# Patient Record
Sex: Female | Born: 1965 | Race: White | Hispanic: No | Marital: Married | Smoking: Never smoker
Health system: Southern US, Community
[De-identification: ages and names within clinical notes are randomized; demographics above are authoritative.]

## PROBLEM LIST (undated history)

## (undated) DIAGNOSIS — J189 Pneumonia, unspecified organism: Secondary | ICD-10-CM

## (undated) DIAGNOSIS — F419 Anxiety disorder, unspecified: Secondary | ICD-10-CM

## (undated) DIAGNOSIS — K5790 Diverticulosis of intestine, part unspecified, without perforation or abscess without bleeding: Secondary | ICD-10-CM

## (undated) DIAGNOSIS — G43909 Migraine, unspecified, not intractable, without status migrainosus: Secondary | ICD-10-CM

## (undated) DIAGNOSIS — K648 Other hemorrhoids: Secondary | ICD-10-CM

## (undated) DIAGNOSIS — E0590 Thyrotoxicosis, unspecified without thyrotoxic crisis or storm: Secondary | ICD-10-CM

## (undated) DIAGNOSIS — D239 Other benign neoplasm of skin, unspecified: Secondary | ICD-10-CM

## (undated) HISTORY — DX: Other benign neoplasm of skin, unspecified: D23.9

## (undated) HISTORY — DX: Pneumonia, unspecified organism: J18.9

## (undated) HISTORY — DX: Thyrotoxicosis, unspecified without thyrotoxic crisis or storm: E05.90

## (undated) HISTORY — DX: Diverticulosis of intestine, part unspecified, without perforation or abscess without bleeding: K57.90

## (undated) HISTORY — PX: FOOT SURGERY: SHX648

## (undated) HISTORY — PX: ANTERIOR CRUCIATE LIGAMENT REPAIR: SHX115

## (undated) HISTORY — DX: Migraine, unspecified, not intractable, without status migrainosus: G43.909

## (undated) HISTORY — DX: Anxiety disorder, unspecified: F41.9

## (undated) HISTORY — DX: Other hemorrhoids: K64.8

---

## 1998-11-17 ENCOUNTER — Other Ambulatory Visit: Admission: RE | Admit: 1998-11-17 | Discharge: 1998-11-17 | Payer: Self-pay | Admitting: Obstetrics and Gynecology

## 1999-05-19 ENCOUNTER — Inpatient Hospital Stay (HOSPITAL_COMMUNITY): Admission: AD | Admit: 1999-05-19 | Discharge: 1999-05-19 | Payer: Self-pay | Admitting: Obstetrics and Gynecology

## 1999-05-20 ENCOUNTER — Inpatient Hospital Stay (HOSPITAL_COMMUNITY): Admission: AD | Admit: 1999-05-20 | Discharge: 1999-05-20 | Payer: Self-pay | Admitting: Obstetrics and Gynecology

## 1999-05-22 ENCOUNTER — Inpatient Hospital Stay (HOSPITAL_COMMUNITY): Admission: AD | Admit: 1999-05-22 | Discharge: 1999-05-24 | Payer: Self-pay | Admitting: Obstetrics and Gynecology

## 1999-05-25 ENCOUNTER — Inpatient Hospital Stay (HOSPITAL_COMMUNITY): Admission: AD | Admit: 1999-05-25 | Discharge: 1999-05-30 | Payer: Self-pay | Admitting: Obstetrics and Gynecology

## 1999-05-25 ENCOUNTER — Encounter (INDEPENDENT_AMBULATORY_CARE_PROVIDER_SITE_OTHER): Payer: Self-pay | Admitting: Specialist

## 1999-05-26 ENCOUNTER — Encounter: Payer: Self-pay | Admitting: Obstetrics and Gynecology

## 1999-05-28 ENCOUNTER — Encounter: Payer: Self-pay | Admitting: Obstetrics and Gynecology

## 1999-05-29 ENCOUNTER — Encounter (HOSPITAL_COMMUNITY): Admission: RE | Admit: 1999-05-29 | Discharge: 1999-08-27 | Payer: Self-pay | Admitting: Obstetrics and Gynecology

## 1999-07-06 ENCOUNTER — Other Ambulatory Visit: Admission: RE | Admit: 1999-07-06 | Discharge: 1999-07-06 | Payer: Self-pay | Admitting: Obstetrics and Gynecology

## 1999-08-30 ENCOUNTER — Encounter (HOSPITAL_COMMUNITY): Admission: RE | Admit: 1999-08-30 | Discharge: 1999-11-28 | Payer: Self-pay | Admitting: Obstetrics and Gynecology

## 2000-07-06 ENCOUNTER — Other Ambulatory Visit: Admission: RE | Admit: 2000-07-06 | Discharge: 2000-07-06 | Payer: Self-pay | Admitting: Obstetrics and Gynecology

## 2001-01-07 ENCOUNTER — Other Ambulatory Visit: Admission: RE | Admit: 2001-01-07 | Discharge: 2001-01-07 | Payer: Self-pay | Admitting: Obstetrics and Gynecology

## 2001-07-09 ENCOUNTER — Other Ambulatory Visit: Admission: RE | Admit: 2001-07-09 | Discharge: 2001-07-09 | Payer: Self-pay | Admitting: Obstetrics and Gynecology

## 2002-07-07 ENCOUNTER — Other Ambulatory Visit: Admission: RE | Admit: 2002-07-07 | Discharge: 2002-07-07 | Payer: Self-pay | Admitting: Obstetrics and Gynecology

## 2002-11-05 ENCOUNTER — Inpatient Hospital Stay (HOSPITAL_COMMUNITY): Admission: AD | Admit: 2002-11-05 | Discharge: 2002-11-05 | Payer: Self-pay | Admitting: *Deleted

## 2003-02-04 ENCOUNTER — Inpatient Hospital Stay (HOSPITAL_COMMUNITY): Admission: AD | Admit: 2003-02-04 | Discharge: 2003-02-06 | Payer: Self-pay | Admitting: Obstetrics and Gynecology

## 2003-03-18 ENCOUNTER — Other Ambulatory Visit: Admission: RE | Admit: 2003-03-18 | Discharge: 2003-03-18 | Payer: Self-pay | Admitting: Obstetrics and Gynecology

## 2004-10-17 ENCOUNTER — Other Ambulatory Visit: Admission: RE | Admit: 2004-10-17 | Discharge: 2004-10-17 | Payer: Self-pay | Admitting: Obstetrics and Gynecology

## 2005-10-26 ENCOUNTER — Encounter: Admission: RE | Admit: 2005-10-26 | Discharge: 2005-10-26 | Payer: Self-pay | Admitting: Obstetrics and Gynecology

## 2006-10-31 ENCOUNTER — Encounter: Admission: RE | Admit: 2006-10-31 | Discharge: 2006-10-31 | Payer: Self-pay | Admitting: Obstetrics and Gynecology

## 2006-11-15 ENCOUNTER — Encounter: Admission: RE | Admit: 2006-11-15 | Discharge: 2006-11-15 | Payer: Self-pay | Admitting: Obstetrics and Gynecology

## 2007-02-21 ENCOUNTER — Ambulatory Visit: Payer: Self-pay | Admitting: Family Medicine

## 2008-01-24 ENCOUNTER — Encounter: Admission: RE | Admit: 2008-01-24 | Discharge: 2008-01-24 | Payer: Self-pay | Admitting: Obstetrics and Gynecology

## 2008-02-06 ENCOUNTER — Ambulatory Visit: Payer: Self-pay | Admitting: Family Medicine

## 2009-01-28 ENCOUNTER — Encounter: Admission: RE | Admit: 2009-01-28 | Discharge: 2009-01-28 | Payer: Self-pay | Admitting: Obstetrics and Gynecology

## 2009-02-01 ENCOUNTER — Encounter: Admission: RE | Admit: 2009-02-01 | Discharge: 2009-02-01 | Payer: Self-pay | Admitting: Obstetrics and Gynecology

## 2016-11-17 ENCOUNTER — Ambulatory Visit: Payer: Self-pay | Admitting: Internal Medicine

## 2016-12-29 ENCOUNTER — Ambulatory Visit: Payer: Self-pay | Admitting: Internal Medicine

## 2017-01-17 ENCOUNTER — Ambulatory Visit: Payer: Self-pay | Admitting: Internal Medicine

## 2017-10-14 DIAGNOSIS — J019 Acute sinusitis, unspecified: Secondary | ICD-10-CM | POA: Diagnosis not present

## 2017-10-18 DIAGNOSIS — M898X1 Other specified disorders of bone, shoulder: Secondary | ICD-10-CM | POA: Diagnosis not present

## 2017-10-18 DIAGNOSIS — J0100 Acute maxillary sinusitis, unspecified: Secondary | ICD-10-CM | POA: Diagnosis not present

## 2017-10-18 DIAGNOSIS — M12211 Villonodular synovitis (pigmented), right shoulder: Secondary | ICD-10-CM | POA: Diagnosis not present

## 2017-11-01 DIAGNOSIS — R6889 Other general symptoms and signs: Secondary | ICD-10-CM | POA: Diagnosis not present

## 2017-11-02 DIAGNOSIS — Z1231 Encounter for screening mammogram for malignant neoplasm of breast: Secondary | ICD-10-CM | POA: Diagnosis not present

## 2017-11-02 DIAGNOSIS — Z01419 Encounter for gynecological examination (general) (routine) without abnormal findings: Secondary | ICD-10-CM | POA: Diagnosis not present

## 2017-11-02 DIAGNOSIS — Z6820 Body mass index (BMI) 20.0-20.9, adult: Secondary | ICD-10-CM | POA: Diagnosis not present

## 2018-05-28 DIAGNOSIS — Z808 Family history of malignant neoplasm of other organs or systems: Secondary | ICD-10-CM | POA: Diagnosis not present

## 2018-05-28 DIAGNOSIS — D239 Other benign neoplasm of skin, unspecified: Secondary | ICD-10-CM | POA: Diagnosis not present

## 2018-05-28 DIAGNOSIS — D229 Melanocytic nevi, unspecified: Secondary | ICD-10-CM | POA: Diagnosis not present

## 2018-05-28 DIAGNOSIS — D2271 Melanocytic nevi of right lower limb, including hip: Secondary | ICD-10-CM | POA: Diagnosis not present

## 2018-06-15 DIAGNOSIS — J0140 Acute pansinusitis, unspecified: Secondary | ICD-10-CM | POA: Diagnosis not present

## 2018-07-23 DIAGNOSIS — Z23 Encounter for immunization: Secondary | ICD-10-CM | POA: Diagnosis not present

## 2018-10-27 DIAGNOSIS — J069 Acute upper respiratory infection, unspecified: Secondary | ICD-10-CM | POA: Diagnosis not present

## 2018-11-05 DIAGNOSIS — N939 Abnormal uterine and vaginal bleeding, unspecified: Secondary | ICD-10-CM | POA: Diagnosis not present

## 2018-11-05 DIAGNOSIS — R7989 Other specified abnormal findings of blood chemistry: Secondary | ICD-10-CM | POA: Diagnosis not present

## 2018-11-05 DIAGNOSIS — Z1321 Encounter for screening for nutritional disorder: Secondary | ICD-10-CM | POA: Diagnosis not present

## 2018-11-05 DIAGNOSIS — Z01419 Encounter for gynecological examination (general) (routine) without abnormal findings: Secondary | ICD-10-CM | POA: Diagnosis not present

## 2018-11-05 DIAGNOSIS — Z1231 Encounter for screening mammogram for malignant neoplasm of breast: Secondary | ICD-10-CM | POA: Diagnosis not present

## 2018-11-05 DIAGNOSIS — Z6821 Body mass index (BMI) 21.0-21.9, adult: Secondary | ICD-10-CM | POA: Diagnosis not present

## 2018-11-29 ENCOUNTER — Encounter: Payer: Self-pay | Admitting: Internal Medicine

## 2018-11-29 ENCOUNTER — Ambulatory Visit: Payer: 59 | Admitting: Internal Medicine

## 2018-11-29 VITALS — BP 128/80 | HR 76 | Ht 65.0 in | Wt 129.0 lb

## 2018-11-29 DIAGNOSIS — E041 Nontoxic single thyroid nodule: Secondary | ICD-10-CM | POA: Diagnosis not present

## 2018-11-29 DIAGNOSIS — E0590 Thyrotoxicosis, unspecified without thyrotoxic crisis or storm: Secondary | ICD-10-CM | POA: Diagnosis not present

## 2018-11-29 LAB — TSH: TSH: <0.01 u[IU]/mL — ABNORMAL LOW (ref 0.35–4.50)

## 2018-11-29 LAB — T4, FREE: Free T4: 1.05 ng/dL (ref 0.60–1.60)

## 2018-11-29 LAB — T3, FREE: T3, Free: 6 pg/mL — ABNORMAL HIGH (ref 2.3–4.2)
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
## 2018-11-29 NOTE — Patient Instructions (Signed)
Please stop at the lab.  Please come back for a follow-up appointment in 4 months.   Hyperthyroidism  Hyperthyroidism is when the thyroid gland is too active (overactive). The thyroid gland is a small gland located in the lower front part of the neck, just in front of the windpipe (trachea). This gland makes hormones that help control how the body uses food for energy (metabolism) as well as how the heart and brain function. These hormones also play a role in keeping your bones strong. When the thyroid is overactive, it produces too much of a hormone called thyroxine. What are the causes? This condition may be caused by:  Graves' disease. This is a disorder in which the body's disease-fighting system (immune system) attacks the thyroid gland. This is the most common cause.  Inflammation of the thyroid gland.  A tumor in the thyroid gland.  Use of certain medicines, including: ? Prescription thyroid hormone replacement. ? Herbal supplements that mimic thyroid hormones. ? Amiodarone therapy.  Solid or fluid-filled lumps within your thyroid gland (thyroid nodules).  Taking in a large amount of iodine from foods or medicines. What increases the risk? You are more likely to develop this condition if:  You are female.  You have a family history of thyroid conditions.  You smoke tobacco.  You use a medicine called lithium.  You take medicines that affect the immune system (immunosuppressants). What are the signs or symptoms? Symptoms of this condition include:  Nervousness.  Inability to tolerate heat.  Unexplained weight loss.  Diarrhea.  Change in the texture of hair or skin.  Heart skipping beats or making extra beats.  Rapid heart rate.  Loss of menstruation.  Shaky hands.  Fatigue.  Restlessness.  Sleep problems.  Enlarged thyroid gland or a lump in the thyroid (nodule). You may also have symptoms of Graves' disease, which may include:  Protruding  eyes.  Dry eyes.  Red or swollen eyes.  Problems with vision. How is this diagnosed? This condition may be diagnosed based on:  Your symptoms and medical history.  A physical exam.  Blood tests.  Thyroid ultrasound. This test involves using sound waves to produce images of the thyroid gland.  A thyroid scan. A radioactive substance is injected into a vein, and images show how much iodine is present in the thyroid.  Radioactive iodine uptake test (RAIU). A small amount of radioactive iodine is given by mouth to see how much iodine the thyroid absorbs after a certain amount of time. How is this treated? Treatment depends on the cause and severity of the condition. Treatment may include:  Medicines to reduce the amount of thyroid hormone your body makes.  Radioactive iodine treatment (radioiodine therapy). This involves swallowing a small dose of radioactive iodine, in capsule or liquid form, to kill thyroid cells.  Surgery to remove part or all of your thyroid gland. You may need to take thyroid hormone replacement medicine for the rest of your life after thyroid surgery.  Medicines to help manage your symptoms. Follow these instructions at home:   Take over-the-counter and prescription medicines only as told by your health care provider.  Do not use any products that contain nicotine or tobacco, such as cigarettes and e-cigarettes. If you need help quitting, ask your health care provider.  Follow any instructions from your health care provider about diet. You may be instructed to limit foods that contain iodine.  Keep all follow-up visits as told by your health care provider. This   is important. ? You will need to have blood tests regularly so that your health care provider can monitor your condition. Contact a health care provider if:  Your symptoms do not get better with treatment.  You have a fever.  You are taking thyroid hormone replacement medicine and you: ? Have  symptoms of depression. ? Feel like you are tired all the time. ? Gain weight. Get help right away if:  You have chest pain.  You have decreased alertness or a change in your awareness.  You have abdominal pain.  You feel dizzy.  You have a rapid heartbeat.  You have an irregular heartbeat.  You have difficulty breathing. Summary  The thyroid gland is a small gland located in the lower front part of the neck, just in front of the windpipe (trachea).  Hyperthyroidism is when the thyroid gland is too active (overactive) and produces too much of a hormone called thyroxine.  The most common cause is Graves' disease, a disorder in which your immune system attacks the thyroid gland.  Hyperthyroidism can cause various symptoms, such as unexplained weight loss, nervousness, inability to tolerate heat, or changes in your heartbeat.  Treatment may include medicine to reduce the amount of thyroid hormone your body makes, radioiodine therapy, surgery, or medicines to manage symptoms. This information is not intended to replace advice given to you by your health care provider. Make sure you discuss any questions you have with your health care provider. Document Released: 09/18/2005 Document Revised: 08/29/2017 Document Reviewed: 08/29/2017 Elsevier Interactive Patient Education  2019 [employer]   

## 2018-12-04 ENCOUNTER — Encounter: Payer: Self-pay | Admitting: Internal Medicine

## 2018-12-05 DIAGNOSIS — E0590 Thyrotoxicosis, unspecified without thyrotoxic crisis or storm: Secondary | ICD-10-CM | POA: Insufficient documentation

## 2018-12-05 DIAGNOSIS — E041 Nontoxic single thyroid nodule: Secondary | ICD-10-CM | POA: Insufficient documentation

## 2018-12-05 LAB — THYROID STIMULATING IMMUNOGLOBULIN: TSI: <89 % baseline (ref <140)

## 2018-12-15 ENCOUNTER — Encounter: Payer: Self-pay | Admitting: Internal Medicine

## 2018-12-16 ENCOUNTER — Other Ambulatory Visit: Payer: Self-pay | Admitting: Internal Medicine

## 2018-12-16 ENCOUNTER — Encounter: Payer: Self-pay | Admitting: Internal Medicine

## 2018-12-16 MED ORDER — ATENOLOL 25 MG PO TABS: 25.0000 mg | ORAL_TABLET | Freq: Every day | ORAL | 5 refills | Status: DC

## 2018-12-19 ENCOUNTER — Encounter (HOSPITAL_COMMUNITY): Payer: 59

## 2018-12-19 ENCOUNTER — Other Ambulatory Visit (HOSPITAL_COMMUNITY): Payer: 59

## 2018-12-19 ENCOUNTER — Encounter (HOSPITAL_COMMUNITY): Payer: Self-pay

## 2018-12-20 ENCOUNTER — Other Ambulatory Visit (HOSPITAL_COMMUNITY): Payer: 59

## 2019-01-27 ENCOUNTER — Encounter: Payer: Self-pay | Admitting: Internal Medicine

## 2019-01-29 ENCOUNTER — Encounter (HOSPITAL_COMMUNITY): Payer: 59

## 2019-01-29 ENCOUNTER — Ambulatory Visit (HOSPITAL_COMMUNITY): Payer: 59

## 2019-01-29 NOTE — Telephone Encounter (Signed)
Please advise 

## 2019-01-30 ENCOUNTER — Other Ambulatory Visit (HOSPITAL_COMMUNITY): Payer: 59

## 2019-01-30 ENCOUNTER — Ambulatory Visit (HOSPITAL_COMMUNITY): Payer: 59

## 2019-01-30 ENCOUNTER — Encounter (HOSPITAL_COMMUNITY): Payer: 59

## 2019-01-31 ENCOUNTER — Other Ambulatory Visit (HOSPITAL_COMMUNITY): Payer: 59

## 2019-01-31 ENCOUNTER — Encounter (HOSPITAL_COMMUNITY): Payer: 59

## 2019-02-13 ENCOUNTER — Ambulatory Visit (HOSPITAL_COMMUNITY)
Admission: RE | Admit: 2019-02-13 | Discharge: 2019-02-13 | Disposition: A | Discharge status: Home / Self Care | Payer: 59 | Source: Ambulatory Visit | Attending: Internal Medicine | Admitting: Internal Medicine

## 2019-02-13 ENCOUNTER — Other Ambulatory Visit: Payer: Self-pay

## 2019-02-13 DIAGNOSIS — E0520 Thyrotoxicosis with toxic multinodular goiter without thyrotoxic crisis or storm: Secondary | ICD-10-CM | POA: Diagnosis not present

## 2019-02-13 DIAGNOSIS — E041 Nontoxic single thyroid nodule: Secondary | ICD-10-CM | POA: Diagnosis not present

## 2019-02-13 DIAGNOSIS — E0590 Thyrotoxicosis, unspecified without thyrotoxic crisis or storm: Secondary | ICD-10-CM | POA: Diagnosis not present

## 2019-02-13 MED ORDER — SODIUM IODIDE I-123 7.4 MBQ CAPS
450.0000 | ORAL_CAPSULE | Freq: Once | ORAL | Status: AC
  Administered 2019-02-13: 450 via ORAL

## 2019-02-14 ENCOUNTER — Encounter (HOSPITAL_COMMUNITY)
Admission: RE | Admit: 2019-02-14 | Discharge: 2019-02-14 | Disposition: A | Discharge status: Home / Self Care | Payer: 59 | Source: Ambulatory Visit | Attending: Internal Medicine | Admitting: Internal Medicine

## 2019-02-17 ENCOUNTER — Other Ambulatory Visit: Payer: Self-pay | Admitting: Internal Medicine

## 2019-02-17 DIAGNOSIS — E0590 Thyrotoxicosis, unspecified without thyrotoxic crisis or storm: Secondary | ICD-10-CM

## 2019-02-20 ENCOUNTER — Telehealth: Payer: Self-pay | Admitting: Internal Medicine

## 2019-02-20 ENCOUNTER — Other Ambulatory Visit: Payer: Self-pay | Admitting: Internal Medicine

## 2019-02-20 ENCOUNTER — Encounter: Payer: Self-pay | Admitting: Internal Medicine

## 2019-02-20 DIAGNOSIS — E041 Nontoxic single thyroid nodule: Secondary | ICD-10-CM

## 2019-02-20 NOTE — Telephone Encounter (Signed)
Patient called stating Dr. Cruzita Lederer told patient to call our office to get scheduled for an Ultrasound of Thyroid. Please call patient at [facility] [phone]. Leave message on voice mail if no answer and patient will call back.

## 2019-02-20 NOTE — Telephone Encounter (Signed)
I ordered the ultrasound.  I will send her a message to my chart.

## 2019-03-25 ENCOUNTER — Ambulatory Visit
Admission: RE | Admit: 2019-03-25 | Discharge: 2019-03-25 | Disposition: A | Discharge status: Home / Self Care | Payer: 59 | Source: Ambulatory Visit | Attending: Internal Medicine | Admitting: Internal Medicine

## 2019-03-25 DIAGNOSIS — E041 Nontoxic single thyroid nodule: Secondary | ICD-10-CM

## 2019-03-26 ENCOUNTER — Encounter: Payer: Self-pay | Admitting: Internal Medicine

## 2019-03-26 ENCOUNTER — Telehealth: Payer: Self-pay | Admitting: Internal Medicine

## 2019-03-26 NOTE — Telephone Encounter (Signed)
Patient called re: Ultrasound showed 3 thyroid nodules that need biopsy asap. Please call patient at ph# [phone] to advise.

## 2019-03-27 ENCOUNTER — Other Ambulatory Visit: Payer: Self-pay | Admitting: Internal Medicine

## 2019-03-27 ENCOUNTER — Encounter: Payer: Self-pay | Admitting: Internal Medicine

## 2019-03-27 ENCOUNTER — Telehealth: Payer: Self-pay | Admitting: Internal Medicine

## 2019-03-27 DIAGNOSIS — E041 Nontoxic single thyroid nodule: Secondary | ICD-10-CM

## 2019-03-27 NOTE — Telephone Encounter (Signed)
I will send referral to GI and they will reach out to the patient for scheduling

## 2019-03-27 NOTE — Telephone Encounter (Signed)
I ordered them and let them know she wanted them done today with results by tomorrow

## 2019-03-27 NOTE — Telephone Encounter (Signed)
Jennifer Copeland, is this something the patient can just call and schedule?

## 2019-03-27 NOTE — Telephone Encounter (Signed)
Jennifer Copeland with [facility] ph# [phone] called re: Orders she received-thyroid biopsy nodule #2-right superior 3.6 nodule-per Radiologist did not meet criteria for biopsy nor did it meet criteria for follow up. Please call Jennifer Copeland at the ph# listed above.

## 2019-03-27 NOTE — Telephone Encounter (Signed)
Spoke to patient, she has called 3 times and sent 2 Mychart message. She is very worried, anxious and frustrated. Patient has wanted this ordered already and wanted the biopsy done today.

## 2019-03-27 NOTE — Telephone Encounter (Signed)
Just FYI: I called and talked to her -I recommended biopsy of the right large thyroid nodule due to the fact that it has a very increased blood flow.  [place] told me that even if this is approved, it may not be possible to do this in the same session on August 7, and the appointment may need to be delayed further.  Therefore, in this case, we may need to go ahead with the 2 biopsies that were recommended initially and just repeat an ultrasound to follow-up on the right thyroid nodule in 1 year.  However, I would prefer for the nodules to be all been biopsied during her appointment on 05/09/2019, if possible.

## 2019-03-27 NOTE — Telephone Encounter (Signed)
Patient's sister called back and was informed the order is in and sent to [place]. They will call to set up appointment.

## 2019-03-28 ENCOUNTER — Ambulatory Visit: Payer: 59 | Admitting: Internal Medicine

## 2019-04-08 ENCOUNTER — Ambulatory Visit
Admission: RE | Admit: 2019-04-08 | Discharge: 2019-04-08 | Disposition: A | Discharge status: Home / Self Care | Payer: 59 | Source: Ambulatory Visit | Attending: Internal Medicine | Admitting: Internal Medicine

## 2019-04-08 ENCOUNTER — Other Ambulatory Visit (HOSPITAL_COMMUNITY)
Admission: RE | Admit: 2019-04-08 | Discharge: 2019-04-08 | Disposition: A | Discharge status: Home / Self Care | Payer: 59 | Source: Ambulatory Visit | Attending: Student | Admitting: Student

## 2019-04-08 DIAGNOSIS — E041 Nontoxic single thyroid nodule: Secondary | ICD-10-CM | POA: Insufficient documentation

## 2019-04-09 ENCOUNTER — Encounter: Payer: Self-pay | Admitting: Internal Medicine

## 2019-04-14 ENCOUNTER — Encounter: Payer: Self-pay | Admitting: Internal Medicine

## 2019-04-14 ENCOUNTER — Ambulatory Visit (INDEPENDENT_AMBULATORY_CARE_PROVIDER_SITE_OTHER): Payer: 59 | Admitting: Internal Medicine

## 2019-04-14 ENCOUNTER — Other Ambulatory Visit: Payer: Self-pay

## 2019-04-14 DIAGNOSIS — E041 Nontoxic single thyroid nodule: Secondary | ICD-10-CM

## 2019-04-14 DIAGNOSIS — E0590 Thyrotoxicosis, unspecified without thyrotoxic crisis or storm: Secondary | ICD-10-CM | POA: Diagnosis not present
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
## 2019-04-14 NOTE — Patient Instructions (Signed)
Please come back for labs at your convenience.  Please come back for a follow-up appointment in 3 months.

## 2019-05-28 ENCOUNTER — Telehealth: Payer: Self-pay | Admitting: Internal Medicine

## 2019-05-28 NOTE — Telephone Encounter (Signed)
Patient is wanting her lab orders sent to [facility] in [place] off of [address].  Please Advise, Thanks

## 2019-05-30 ENCOUNTER — Other Ambulatory Visit: Payer: Self-pay

## 2019-05-30 DIAGNOSIS — E0590 Thyrotoxicosis, unspecified without thyrotoxic crisis or storm: Secondary | ICD-10-CM

## 2019-05-30 NOTE — Telephone Encounter (Signed)
LM that labs were ordered for [facility] & she should be able to make appointment to have drawn there.

## 2019-06-12 ENCOUNTER — Other Ambulatory Visit: Payer: Self-pay

## 2019-06-12 DIAGNOSIS — E0590 Thyrotoxicosis, unspecified without thyrotoxic crisis or storm: Secondary | ICD-10-CM

## 2019-06-13 ENCOUNTER — Other Ambulatory Visit: Payer: Self-pay | Admitting: Internal Medicine

## 2019-06-13 LAB — T3, FREE: T3, Free: 4.4 pg/mL (ref 2.0–4.4)

## 2019-06-13 LAB — T4, FREE: Free T4: 1.27 ng/dL (ref 0.82–1.77)

## 2019-06-13 LAB — TSH: TSH: <0.005 u[IU]/mL — ABNORMAL LOW (ref 0.450–4.500)

## 2019-06-13 MED ORDER — METHIMAZOLE 5 MG PO TABS: 5.0000 mg | ORAL_TABLET | Freq: Three times a day (TID) | ORAL | 3 refills | Status: DC

## 2019-06-13 MED ORDER — METHIMAZOLE 5 MG PO TABS: 5.0000 mg | ORAL_TABLET | Freq: Every day | ORAL | 3 refills | Status: DC

## 2019-06-13 NOTE — Addendum Note (Signed)
Addended by: Philemon Kingdom on: 06/13/2019 02:56 PM   Modules accepted: Orders

## 2019-06-20 ENCOUNTER — Encounter: Payer: Self-pay | Admitting: Internal Medicine

## 2019-07-23 ENCOUNTER — Other Ambulatory Visit: Payer: Self-pay

## 2019-07-25 ENCOUNTER — Ambulatory Visit: Payer: 59 | Admitting: Internal Medicine

## 2019-07-25 ENCOUNTER — Encounter: Payer: Self-pay | Admitting: Internal Medicine

## 2019-07-25 ENCOUNTER — Other Ambulatory Visit: Payer: Self-pay

## 2019-07-25 VITALS — BP 108/70 | HR 66 | Ht 65.0 in | Wt 127.0 lb

## 2019-07-25 DIAGNOSIS — E042 Nontoxic multinodular goiter: Secondary | ICD-10-CM | POA: Diagnosis not present

## 2019-07-25 DIAGNOSIS — E041 Nontoxic single thyroid nodule: Secondary | ICD-10-CM | POA: Diagnosis not present

## 2019-07-25 DIAGNOSIS — E0590 Thyrotoxicosis, unspecified without thyrotoxic crisis or storm: Secondary | ICD-10-CM | POA: Diagnosis not present

## 2019-07-25 LAB — T4, FREE: Free T4: 0.87 ng/dL (ref 0.60–1.60)

## 2019-07-25 LAB — T3, FREE: T3, Free: 3.9 pg/mL (ref 2.3–4.2)

## 2019-07-25 LAB — TSH: TSH: <0.01 u[IU]/mL — ABNORMAL LOW (ref 0.35–4.50)
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
## 2019-07-25 NOTE — Patient Instructions (Signed)
Please stop at the lab.  Continue Methimazole 5 mg daily.  Please come back for a follow-up appointment in 6 months.  

## 2019-07-28 LAB — THYROGLOBULIN ANTIBODY: Thyroglobulin Ab: <1 IU/mL (ref <=1)

## 2019-07-28 LAB — THYROID PEROXIDASE ANTIBODY: Thyroperoxidase Ab SerPl-aCnc: 358 IU/mL — ABNORMAL HIGH (ref <9)

## 2019-07-29 MED ORDER — METHIMAZOLE 5 MG PO TABS: ORAL_TABLET | ORAL | 3 refills | Status: DC

## 2019-08-12 IMAGING — US US THYROID
1 series · 12 of 25 positions shown · non-contrast
Comparison: Prior nuclear medicine thyroid uptake study 02/13/2019

CLINICAL DATA: Goiter. 52-year-old female with multiple warm
thyroid nodules and a possible right-sided cold thyroid nodule.

EXAM:
THYROID ULTRASOUND
TECHNIQUE: Ultrasound examination of the thyroid gland and adjacent soft
tissues was performed.

[Series 1: us thyroid · 0.03mm/px · 12 of 74 slices shown]
[im 4/74]
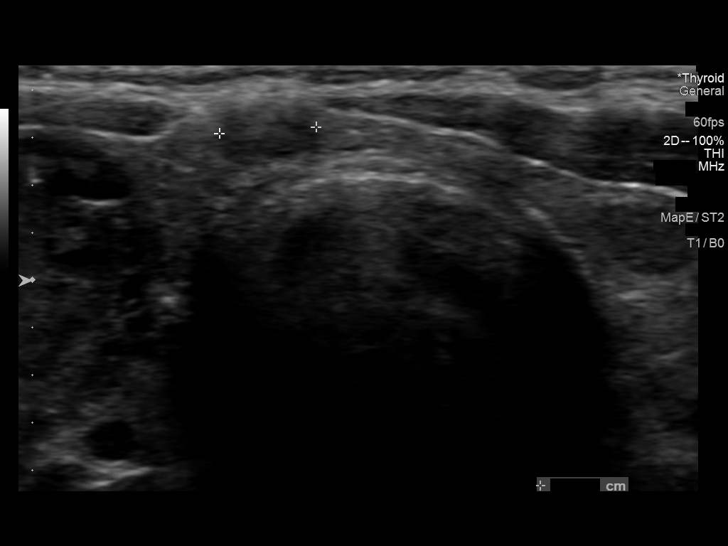
[im 10/74]
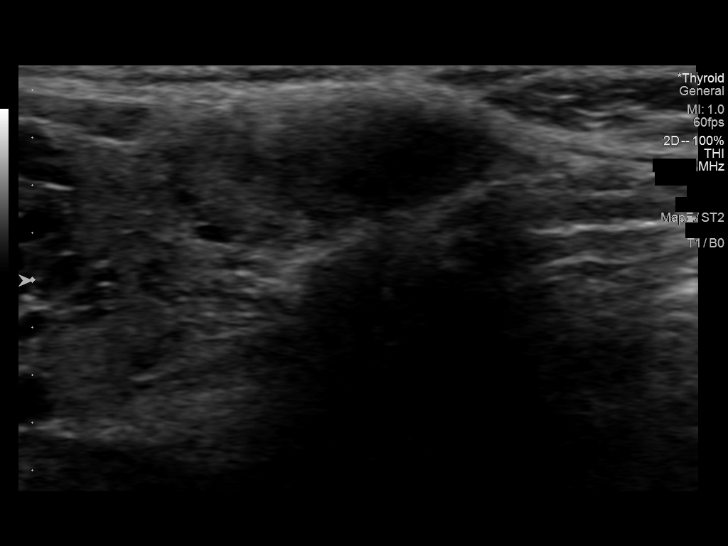
[im 16/74]
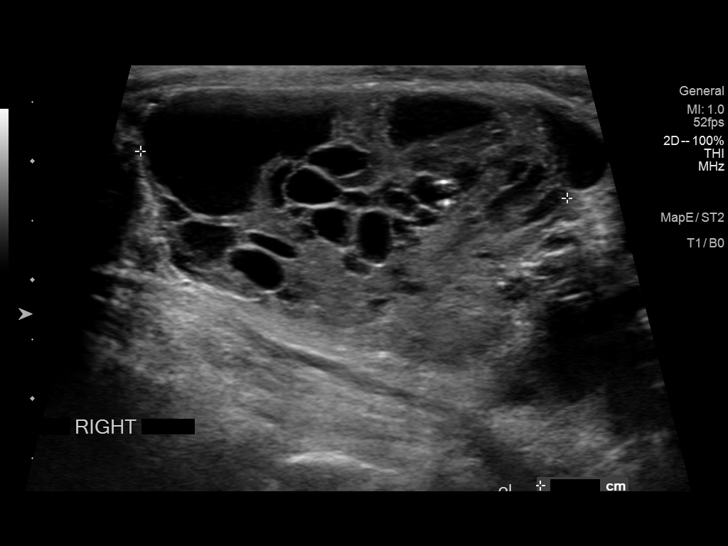
[im 22/74]
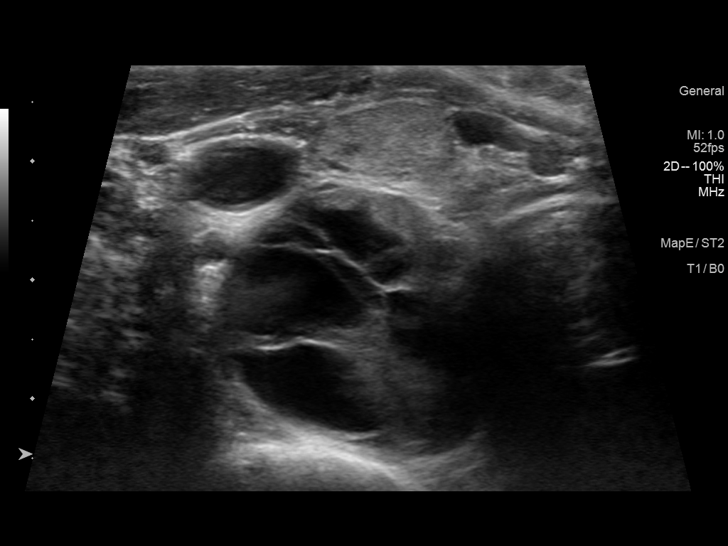
[im 28/74]
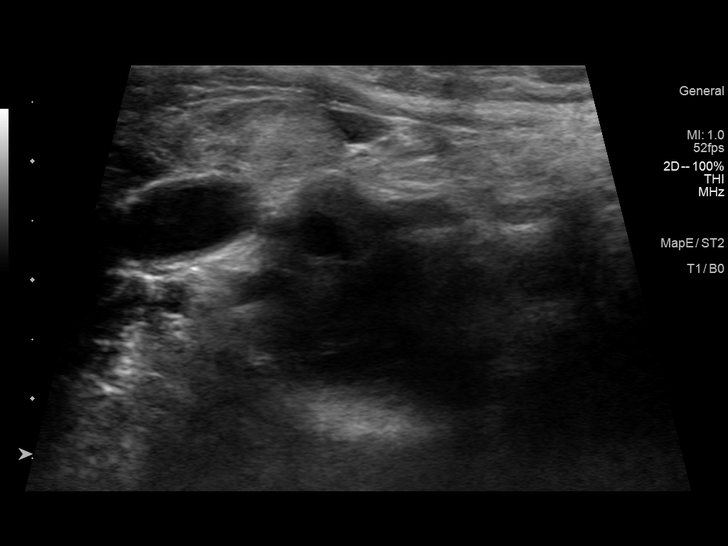
[im 34/74]
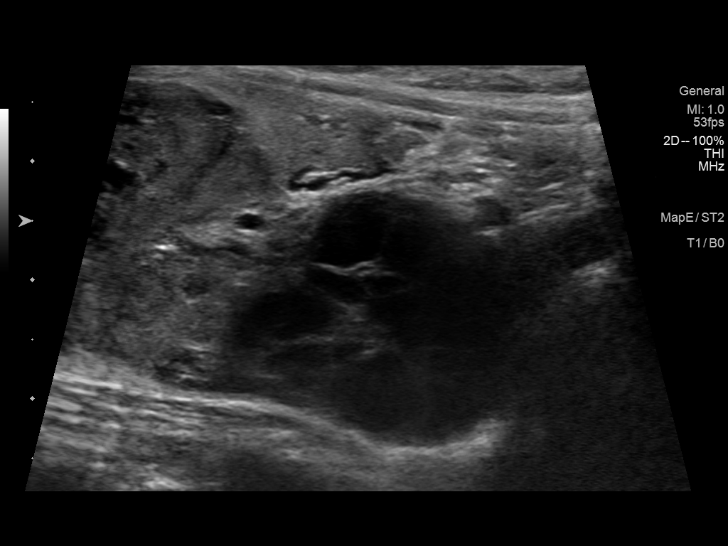
[im 40/74]
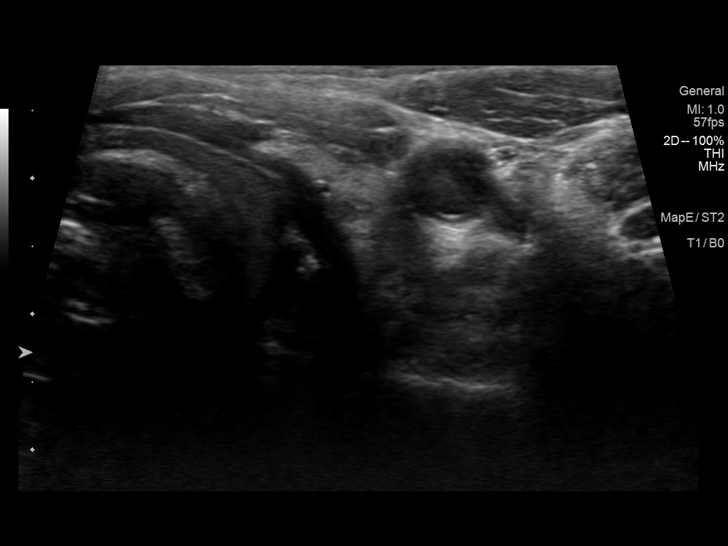
[im 46/74]
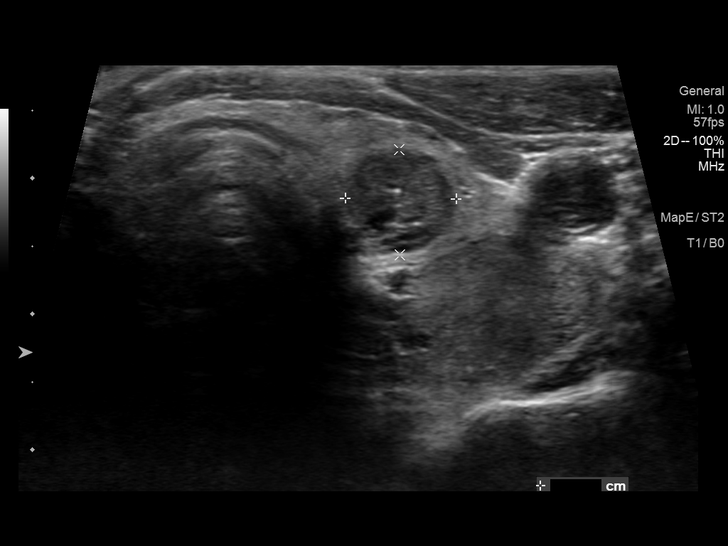
[im 52/74]
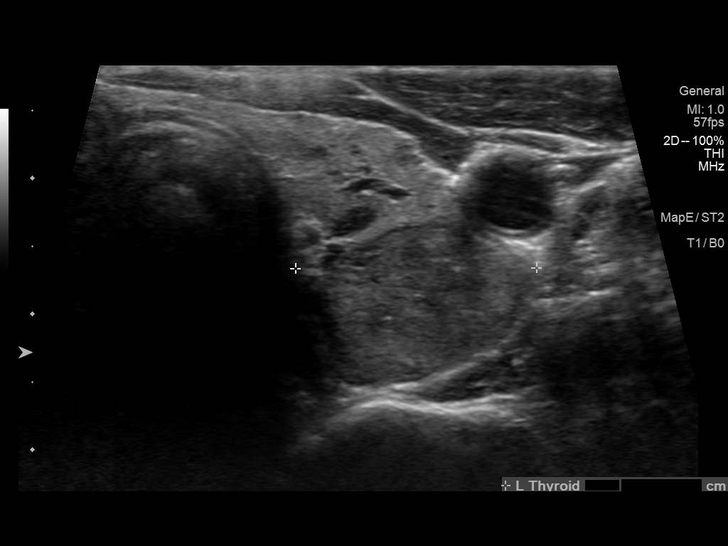
[im 58/74]
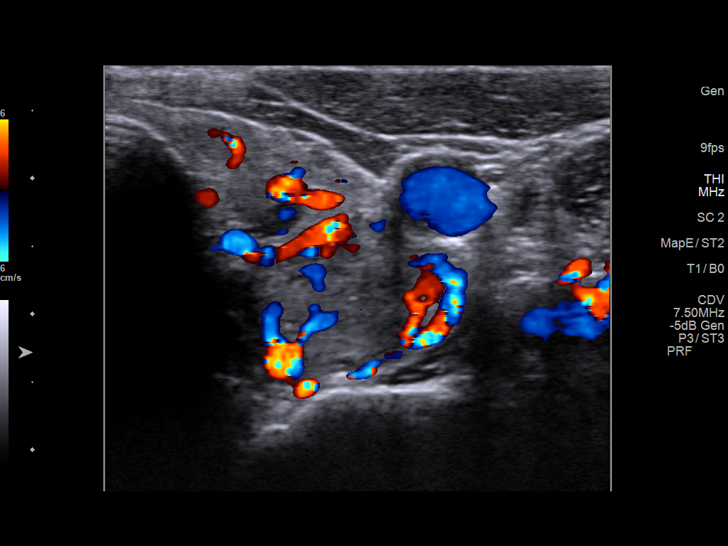
[im 64/74]
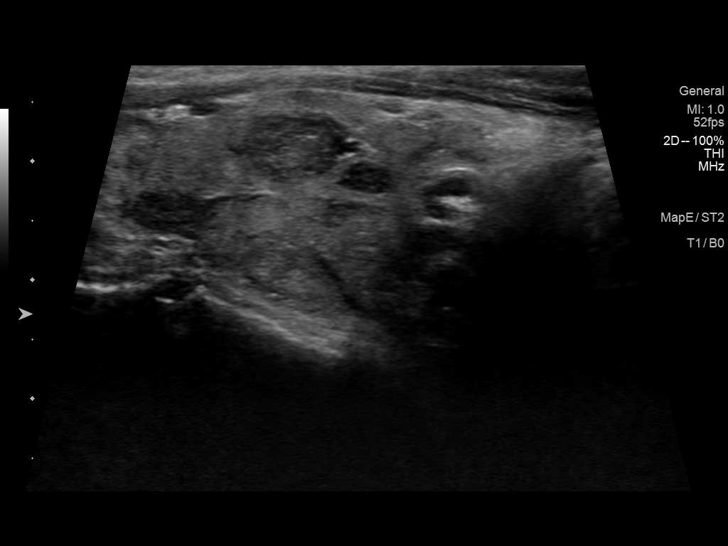
[im 70/74]
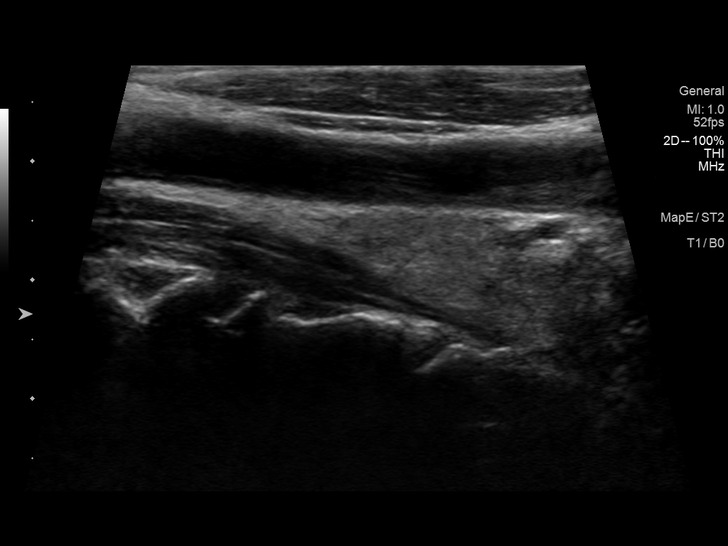

[12 of 25 positions shown; findings below may reference images not displayed]

FINDINGS: Parenchymal Echotexture: Moderately heterogenous

Isthmus: 0.2 cm

Right lobe: 6.1 x 2.6 x 2.8 cm

Left lobe: 4.3 x 1.7 x 1.8 cm

_________________________________________________________

Estimated total number of nodules >/= 1 cm: 5

Number of spongiform nodules >/=  2 cm not described below (TR1): 0

Number of mixed cystic and solid nodules >/= 1.5 cm not described
below (TR2): 0

_________________________________________________________

Nodule # 1:

Location: Isthmus; Inferior

Maximum size: 1.4 cm; Other 2 dimensions: 1.3 x 0.7 cm

Composition: solid/almost completely solid (2)

Echogenicity: hypoechoic (2)

Shape: not taller-than-wide (0)

Margins: smooth (0)

Echogenic foci: none (0)

ACR TI-RADS total points: 1.

ACR TI-RADS risk category: TR4 (4-6 points).

ACR TI-RADS recommendations:

*Given size (>/= 1 - 1.4 cm) and appearance, a follow-up ultrasound
in 1 year should be considered based on TI-RADS criteria.

_________________________________________________________

Nodule # 2:

Location: Right; Superior

Maximum size: 3.6 cm; Other 2 dimensions: 3.0 x 1.9 cm

Composition: spongiform (0)

Echogenicity: isoechoic (1)

Shape: not taller-than-wide (0)

Margins: smooth (0)

Echogenic foci: large comet-tail artifacts (0)

ACR TI-RADS total points: 1.

ACR TI-RADS risk category: TR1 (0-1 points).

ACR TI-RADS recommendations:

This nodule does NOT meet TI-RADS criteria for biopsy or dedicated
follow-up.

_________________________________________________________

Nodule # 3:

Location: Right; Inferior

Maximum size: 3.2 cm; Other 2 dimensions: 2.3 x 2.2 cm

Composition: mixed cystic and solid (1)

Echogenicity: isoechoic (1)

Shape: not taller-than-wide (0)

Margins: smooth (0)

Echogenic foci: large comet-tail artifacts (0)

ACR TI-RADS total points: 2.

ACR TI-RADS risk category: TR2 (2 points).

ACR TI-RADS recommendations:

This nodule does NOT meet TI-RADS criteria for biopsy or dedicated
follow-up.

_________________________________________________________

Nodule # 5:

Location: Left; Mid

Maximum size: 1.1 cm; Other 2 dimensions: 0.8 x 0.8 cm

Composition: solid/almost completely solid (2)

Echogenicity: hypoechoic (2)

Shape: not taller-than-wide (0)

Margins: smooth (0)

Echogenic foci: punctate echogenic foci (3)

ACR TI-RADS total points: 7.

ACR TI-RADS risk category: TR5 (>/= 7 points).

ACR TI-RADS recommendations:

**Given size (>/= 1.0 cm) and appearance, fine needle aspiration of
this highly suspicious nodule should be considered based on TI-RADS
criteria.

_________________________________________________________

Nodule # 6:

Location: Left; Inferior

Maximum size: 2.9 cm; Other 2 dimensions: 1.5 x 1.2 cm

Composition: solid/almost completely solid (2)

Echogenicity: isoechoic (1)

Shape: not taller-than-wide (0)

Margins: smooth (0)

Echogenic foci: none (0)

ACR TI-RADS total points: 3.

ACR TI-RADS risk category: TR3 (3 points).

ACR TI-RADS recommendations:

**Given size (>/= 2.5 cm) and appearance, fine needle aspiration of
this mildly suspicious nodule should be considered based on TI-RADS
criteria.

_________________________________________________________
IMPRESSION: 1. Diffusely enlarged, heterogeneous and multinodular thyroid gland.
2. A 1.1 cm TI-RADS category 5 nodule (labeled # 5) in the left mid
gland meets criteria for consideration of fine-needle aspiration
biopsy.
3. A 2.9 cm TI-RADS category 3 nodule (labeled # 6) in the left
inferior gland also meets criteria for consideration of fine-needle
aspiration biopsy.
4. A 1.4 cm TI-RADS category 4 nodule (labeled # 1) in the inferior
aspect of the thyroid isthmus meets criteria for follow-up
ultrasound in 1 year.
5. Additional bilateral thyroid nodules, mildly complex cystic
nodules and benign appearing spongiform nodules are incidentally
noted and require no further evaluation.

The above is in keeping with the ACR TI-RADS recommendations - [HOSPITAL] 7470;[DATE].

## 2019-08-15 ENCOUNTER — Encounter: Payer: Self-pay | Admitting: Internal Medicine

## 2019-10-20 ENCOUNTER — Encounter: Payer: Self-pay | Admitting: *Deleted

## 2019-10-24 ENCOUNTER — Ambulatory Visit: Payer: 59 | Admitting: Internal Medicine

## 2019-11-07 ENCOUNTER — Ambulatory Visit: Payer: 59 | Admitting: Gastroenterology

## 2019-11-07 ENCOUNTER — Encounter: Payer: Self-pay | Admitting: Gastroenterology

## 2019-11-07 ENCOUNTER — Other Ambulatory Visit: Payer: Self-pay

## 2019-11-07 VITALS — BP 100/80 | HR 88 | Temp 98.8°F | Ht 64.75 in | Wt 136.5 lb

## 2019-11-07 DIAGNOSIS — Z01818 Encounter for other preprocedural examination: Secondary | ICD-10-CM | POA: Diagnosis not present

## 2019-11-07 DIAGNOSIS — L290 Pruritus ani: Secondary | ICD-10-CM

## 2019-11-07 DIAGNOSIS — K5900 Constipation, unspecified: Secondary | ICD-10-CM

## 2019-11-07 DIAGNOSIS — K625 Hemorrhage of anus and rectum: Secondary | ICD-10-CM | POA: Diagnosis not present

## 2019-11-07 MED ORDER — HYDROCORTISONE ACETATE 25 MG RE SUPP: 25.0000 mg | Freq: Two times a day (BID) | RECTAL | 1 refills | Status: DC

## 2019-11-07 MED ORDER — SUTAB 1479-225-188 MG PO TABS: 24.0000 | ORAL_TABLET | ORAL | 0 refills | Status: DC

## 2019-11-07 MED ORDER — HYDROCORTISONE (PERIANAL) 2.5 % EX CREA: 1.0000 "application " | TOPICAL_CREAM | Freq: Two times a day (BID) | CUTANEOUS | 1 refills | Status: AC

## 2019-11-07 NOTE — Progress Notes (Signed)
11/07/2019 TORINA THRUSH [id number] 1965/10/11   HISTORY OF PRESENT ILLNESS: This is a pleasant 54 year old female who is new to our practice.  She is requesting Dr. Hilarie Fredrickson to be her physician.  She presents to the office today with complaints of chronic constipation, rectal bleeding, and anal itching.  She tells me that she has battled with chronic constipation for as long as she can remember.  She does not use anything to help her move her bowels on a regular basis, but usually only moves her bowels a couple times per week and then they are usually very hard stools.  She has seen blood in her stools on 1 or 2 occasions.  Reports perianal itching despite using over-the-counter regimens.  She says that over the counter hydrocortisone does take seem to help to some degree.  She does have her thyroid monitored regularly and is on medication for that.  Other routine blood performed by her PCP, but we do not have access to those results.  Has never had colonoscopy in the past.   Past Medical History:  Diagnosis Date  . Anxiety   . Dermatofibroma   . Hyperthyroidism   . Migraines   . Pneumonia   . Thyrotoxicosis    Past Surgical History:  Procedure Laterality Date  . ANTERIOR CRUCIATE LIGAMENT REPAIR Right    and PCL  . FOOT SURGERY Left     reports that she has never smoked. She has never used smokeless tobacco. She reports current alcohol use. She reports that she does not use drugs. family history includes Anxiety disorder in her mother; Atrial fibrillation in her mother; Barrett's esophagus in her mother; Breast cancer in her mother; COPD in her mother; Depression in her mother; Emphysema in her father; Fibromyalgia in her mother; Macular degeneration in her mother; Melanoma in her brother; Rheum arthritis in her mother. Allergies  Allergen Reactions  . Morphine Nausea And Vomiting      Outpatient Encounter Medications as of 11/07/2019  Medication Sig  . Cholecalciferol 50 MCG  (2000 UT) CAPS Take 1 tablet by mouth once a week.  . clonazePAM (KLONOPIN) 0.5 MG tablet Take 0.5 mg by mouth as needed.  Marland Kitchen MELATONIN PO Take 1 tablet by mouth at bedtime as needed.  . methimazole (TAPAZOLE) 5 MG tablet Take 1 tablet in the morning and 1/2 tablets in the p.m.  . rosuvastatin (CRESTOR) 10 MG tablet Take 10 mg by mouth at bedtime.  . hydrocortisone (ANUSOL-HC) 25 MG suppository Place 1 suppository (25 mg total) rectally 2 (two) times daily.  . Sodium Sulfate-Mag Sulfate-KCl (SUTAB) [phone] MG TABS Take 24 capsules by mouth as directed.  . traZODone (DESYREL) 50 MG tablet Take 50 mg by mouth as needed.   No facility-administered encounter medications on file as of 11/07/2019.     REVIEW OF SYSTEMS  : All other systems reviewed and negative except where noted in the History of Present Illness.   PHYSICAL EXAM: BP 100/80 (BP Location: Left Arm, Patient Position: Sitting, Cuff Size: Normal)   Pulse 88   Temp 98.8 F (37.1 C)   Ht 5' 4.75" (1.645 m) Comment: height measured without shoes  Wt 136 lb 8 oz (61.9 kg)   LMP 01/01/2019 (Within Days)   BMI 22.89 kg/m  General: Well developed white female in no acute distress Head: Normocephalic and atraumatic Eyes:  Sclerae anicteric, conjunctiva pink. Ears: Normal auditory acuity Lungs: Clear throughout to auscultation; no increased WOB. Heart: Regular rate  and rhythm; no M/R/G. Abdomen: Soft, non-distended.  BS present.  Minimal lower abdominal TTP. Rectal:  Small non-inflamed external hemorrhoid noted.  DRE not performed and will be done at the time of colonoscopy. Musculoskeletal: Symmetrical with no gross deformities  Skin: No lesions on visible extremities Extremities: No edema  Neurological: Alert oriented x 4, grossly non-focal Psychological:  Alert and cooperative. Normal mood and affect  ASSESSMENT AND PLAN: *Chronic constipation: She will drink a bottle of magnesium citrate to get things moving and then will  begin MiraLAX daily, increase to twice daily after several days if needed.  She will try this for period of time but if she does not have good results then we can try something such as Linzess or Amitiza. *Rectal bleeding: Likely hemorrhoidal with associated chronic constipation, but is never had a colonoscopy in the past.  We will schedule Dr. Hilarie Fredrickson at the patient's request.  *Anal itching: One small external hemorrhoid seen on exam today.  Not inflamed.  No other abnormalities noted.  Can try hydrocortisone 2.5% cream.  Prescription sent to the pharmacy.  **The risks, benefits, and alternatives to colonoscopy were discussed with the patient and she consents to proceed.    CC:  Tamala Julian, MD

## 2019-11-07 NOTE — Patient Instructions (Addendum)
1.Drink 1 bottle of Magnesium citrate to clean out before starting Miralax.   2. Start Miralax- 1capful ( 17g) daily , increase to twice daily if needed.    We have sent the following medications to your pharmacy for you to pick up at your convenience: Sutab, Hydrocortisone Cream  If you are age 52 or older, your body mass index should be between 23-30. Your Body mass index is 22.89 kg/m. If this is out of the aforementioned range listed, please consider follow up with your Primary Care Provider.  If you are age 35 or younger, your body mass index should be between 19-25. Your Body mass index is 22.89 kg/m. If this is out of the aformentioned range listed, please consider follow up with your Primary Care Provider.    You have been scheduled for a colonoscopy. Please follow written instructions given to you at your visit today.  Please pick up your prep supplies at the pharmacy within the next 1-3 days. If you use inhalers (even only as needed), please bring them with you on the day of your procedure.   Thank you for choosing me and [facility].  Janett Billow Zehr-PA

## 2019-11-13 ENCOUNTER — Telehealth: Payer: Self-pay | Admitting: Gastroenterology

## 2019-11-13 NOTE — Telephone Encounter (Signed)
Patient called stating that she was not able to pick up prep solution from [facility] in time and is requesting that we resubmit.

## 2019-11-13 NOTE — Telephone Encounter (Signed)
I called and left message for pt. I have a sample of the prep if pt would like to come by office and pick-up.

## 2019-11-17 MED ORDER — SUTAB 1479-225-188 MG PO TABS: 24.0000 | ORAL_TABLET | ORAL | 0 refills | Status: DC

## 2019-11-17 NOTE — Telephone Encounter (Signed)
Re-sent rx, if pt is unable to get rx, then sample will be provided.

## 2019-11-19 NOTE — Progress Notes (Signed)
Addendum: Reviewed and agree with assessment and management plan. Makesha Belitz M, MD  

## 2019-12-05 ENCOUNTER — Telehealth: Payer: Self-pay | Admitting: Internal Medicine

## 2019-12-05 NOTE — Telephone Encounter (Signed)
Left message for pt to call back  °

## 2019-12-05 NOTE — Telephone Encounter (Signed)
Patient called stating she has been experiencing sharp pain by her ribcage unable to sleep. Has tried OTC medication but it has not helped her. She will be available after 1pm for advise.

## 2019-12-08 ENCOUNTER — Other Ambulatory Visit: Payer: Self-pay

## 2019-12-08 DIAGNOSIS — R110 Nausea: Secondary | ICD-10-CM

## 2019-12-08 DIAGNOSIS — R6881 Early satiety: Secondary | ICD-10-CM

## 2019-12-08 DIAGNOSIS — R140 Abdominal distension (gaseous): Secondary | ICD-10-CM

## 2019-12-08 NOTE — Telephone Encounter (Signed)
See mychart message, EGD has been added to colon.

## 2019-12-08 NOTE — Telephone Encounter (Signed)
Ok to add EGD to colonoscopy which is scheduled for this Thursday, 12/11/19 Indication: nausea, early satiety, epig pain, bloating Thanks JMP

## 2019-12-10 ENCOUNTER — Encounter: Payer: Self-pay | Admitting: Internal Medicine

## 2019-12-11 ENCOUNTER — Encounter: Payer: Self-pay | Admitting: Internal Medicine

## 2019-12-11 ENCOUNTER — Telehealth: Payer: Self-pay | Admitting: Internal Medicine

## 2019-12-11 ENCOUNTER — Ambulatory Visit (AMBULATORY_SURGERY_CENTER): Payer: 59 | Admitting: Internal Medicine

## 2019-12-11 ENCOUNTER — Other Ambulatory Visit: Payer: Self-pay

## 2019-12-11 VITALS — BP 111/69 | HR 69 | Temp 96.0°F | Resp 15 | Ht 64.0 in | Wt 136.0 lb

## 2019-12-11 DIAGNOSIS — Z1211 Encounter for screening for malignant neoplasm of colon: Secondary | ICD-10-CM | POA: Diagnosis not present

## 2019-12-11 DIAGNOSIS — R1013 Epigastric pain: Secondary | ICD-10-CM

## 2019-12-11 MED ORDER — SODIUM CHLORIDE 0.9 % IV SOLN: 500.0000 mL | Freq: Once | INTRAVENOUS | Status: DC

## 2019-12-11 MED ORDER — PANTOPRAZOLE SODIUM 40 MG PO TBEC: DELAYED_RELEASE_TABLET | ORAL | 1 refills | Status: DC

## 2019-12-11 NOTE — Telephone Encounter (Signed)
I am really not sure what she expects [place] to do about this.  Her appt was made for constipation, hemorrhoid, and blood in stool.  I examined her for complaints of anal itching and we discussed managing her constipation.  I stated that she admitted to 1 or 2 times of seeing blood in her stool in the past because that is the information that she gave to me.

## 2019-12-11 NOTE — Telephone Encounter (Signed)
Pt states that when she came in for her visit to schedule colon she was just here for screening colon. States she mentioned that she had some hemorrhoids dating back to when she had her children but states she has not had any blood in her stools or rectal bleeding. Pt wants to make sure this information is taken out of her chart. Let pt know this note would be sent to Dr. Hilarie Fredrickson and Alonza Bogus PA.

## 2019-12-11 NOTE — Op Note (Signed)
[facility] Patient Name: Jennifer Copeland Procedure Date: 12/11/2019 9:45 AM MRN: [id number] Endoscopist: Jerene Bears , MD Age: 54 Referring MD:  Date of Birth: [birth year] Gender: Female Account #: [id number] Procedure:                Colonoscopy Indications:              Screening for colorectal malignant neoplasm, This                            is the patient's first colonoscopy Medicines:                Monitored Anesthesia Care Procedure:                Pre-Anesthesia Assessment:                           - Prior to the procedure, a History and Physical                            was performed, and patient medications and                            allergies were reviewed. The patient's tolerance of                            previous anesthesia was also reviewed. The risks                            and benefits of the procedure and the sedation                            options and risks were discussed with the patient.                            All questions were answered, and informed consent                            was obtained. Prior Anticoagulants: The patient has                            taken no previous anticoagulant or antiplatelet                            agents. ASA Grade Assessment: II - A patient with                            mild systemic disease. After reviewing the risks                            and benefits, the patient was deemed in                            satisfactory condition to undergo the procedure.  After obtaining informed consent, the colonoscope                            was passed under direct vision. Throughout the                            procedure, the patient's blood pressure, pulse, and                            oxygen saturations were monitored continuously. The                            Colonoscope was introduced through the anus and                            advanced to the cecum,  identified by appendiceal                            orifice and ileocecal valve. The colonoscopy was                            performed without difficulty. The patient tolerated                            the procedure well. The quality of the bowel                            preparation was good. The ileocecal valve,                            appendiceal orifice, and rectum were photographed. Scope In: 10:00:41 AM Scope Out: 10:17:42 AM Scope Withdrawal Time: 0 hours 11 minutes 21 seconds  Total Procedure Duration: 0 hours 17 minutes 1 second  Findings:                 The digital rectal exam was normal.                           A diffuse area of moderate melanosis was found in                            the entire colon.                           A few small-mouthed diverticula were found in the                            descending colon.                           Internal hemorrhoids were found during                            retroflexion. The hemorrhoids were small.  The exam was otherwise without abnormality. Complications:            No immediate complications. Estimated Blood Loss:     Estimated blood loss: none. Impression:               - Melanosis in the colon.                           - Mild diverticulosis in the descending colon.                           - Small internal hemorrhoids.                           - The examination was otherwise normal.                           - No specimens collected. Recommendation:           - Patient has a contact number available for                            emergencies. The signs and symptoms of potential                            delayed complications were discussed with the                            patient. Return to normal activities tomorrow.                            Written discharge instructions were provided to the                            patient.                           - Resume  previous diet.                           - Continue present medications. Be more consistent                            with MiraLax 17 g once to twice daily for chronic                            constipation.                           - Repeat colonoscopy in 10 years for screening                            purposes.                           - Given symptoms discussed prior to colonoscopy  today, patient will have H. Pylori stool Ag test                            and once submitted try pantoprazole 40 mg daily x 1                            month (dyspeptic symptoms and epigastric pain).                            Office follow-up in 6-8 weeks for continuity. If no                            response to PPI then we can evaluate further (EGD                            and consideration of abd [place]). Jerene Bears, MD 12/11/2019 10:22:25 AM This report has been signed electronically.

## 2019-12-11 NOTE — Telephone Encounter (Signed)
Patient is calling states there is miscommunication and\or documentation on her chart and wants to make sure that her diagnosis is correct

## 2019-12-11 NOTE — Patient Instructions (Signed)
Go to lab for H pylori stool Ag test/  After you submit your stool test, start the pantoprazole 40 mg daily for 1 month.  Follow up appt in office in 6-8 weeks to evaluate symptoms.  YOU HAD AN ENDOSCOPIC PROCEDURE TODAY AT [facility]:   Refer to the procedure report that was given to you for any specific questions about what was found during the examination.  If the procedure report does not answer your questions, please call your gastroenterologist to clarify.  If you requested that your care partner not be given the details of your procedure findings, then the procedure report has been included in a sealed envelope for you to review at your convenience later.  YOU SHOULD EXPECT: Some feelings of bloating in the abdomen. Passage of more gas than usual.  Walking can help get rid of the air that was put into your GI tract during the procedure and reduce the bloating. If you had a lower endoscopy (such as a colonoscopy or flexible sigmoidoscopy) you may notice spotting of blood in your stool or on the toilet paper. If you underwent a bowel prep for your procedure, you may not have a normal bowel movement for a few days.  Please Note:  You might notice some irritation and congestion in your nose or some drainage.  This is from the oxygen used during your procedure.  There is no need for concern and it should clear up in a day or so.  SYMPTOMS TO REPORT IMMEDIATELY:   Following lower endoscopy (colonoscopy or flexible sigmoidoscopy):  Excessive amounts of blood in the stool  Significant tenderness or worsening of abdominal pains  Swelling of the abdomen that is new, acute  Fever of 100F or higher  For urgent or emergent issues, a gastroenterologist can be reached at any hour by calling [phone]. Do not use MyChart messaging for urgent concerns.    DIET:  We do recommend a small meal at first, but then you may proceed to your regular diet.  Drink plenty of fluids but you  should avoid alcoholic beverages for 24 hours.  ACTIVITY:  You should plan to take it easy for the rest of today and you should NOT DRIVE or use heavy machinery until tomorrow (because of the sedation medicines used during the test).    FOLLOW UP: Our staff will call the number listed on your records 48-72 hours following your procedure to check on you and address any questions or concerns that you may have regarding the information given to you following your procedure. If we do not reach you, we will leave a message.  We will attempt to reach you two times.  During this call, we will ask if you have developed any symptoms of COVID 19. If you develop any symptoms (ie: fever, flu-like symptoms, shortness of breath, cough etc.) before then, please call [phone].  If you test positive for Covid 19 in the 2 weeks post procedure, please call and report this information to [place].    If any biopsies were taken you will be contacted by phone or by letter within the next 1-3 weeks.  Please call us at [phone] if you have not heard about the biopsies in 3 weeks.    SIGNATURES/CONFIDENTIALITY: You and/or your care partner have signed paperwork which will be entered into your electronic medical record.  These signatures attest to the fact that that the information above on your After Visit Summary has been reviewed  and is understood.  Full responsibility of the confidentiality of this discharge information lies with you and/or your care-partner. 

## 2019-12-11 NOTE — Telephone Encounter (Signed)
Noted, the procedure was performed for screening, it was billed and coded that way

## 2019-12-11 NOTE — Progress Notes (Signed)
To PACU, VSS. Report to [email or web address] 

## 2019-12-15 ENCOUNTER — Telehealth: Payer: Self-pay

## 2019-12-15 ENCOUNTER — Other Ambulatory Visit: Payer: 59

## 2019-12-15 DIAGNOSIS — R1013 Epigastric pain: Secondary | ICD-10-CM

## 2019-12-15 NOTE — Telephone Encounter (Signed)
  Follow up Call-     Left message

## 2019-12-15 NOTE — Telephone Encounter (Signed)
  Follow up Call-  Call back number 12/11/2019  Post procedure Call Back phone  # [phone]  Permission to leave phone message Yes  Some recent data might be hidden     Patient questions:  Do you have a fever, pain , or abdominal swelling? No. Pain Score  0 *  Have you tolerated food without any problems? Yes.    Have you been able to return to your normal activities? Yes.    Do you have any questions about your discharge instructions: Diet   No. Medications  No. Follow up visit  No.  Do you have questions or concerns about your Care? No.  Actions: * If pain score is 4 or above: 1. No action needed, pain <4.Have you developed a fever since your procedure? no  2.   Have you had an respiratory symptoms (SOB or cough) since your procedure? no  3.   Have you tested positive for COVID 19 since your procedure no  4.   Have you had any family members/close contacts diagnosed with the COVID 19 since your procedure?  no   If yes to any of these questions please route to Joylene John, RN and Alphonsa Gin, Therapist, sports.

## 2019-12-15 NOTE — Telephone Encounter (Signed)
Patient's  phone number correct on contact list, will call patient again, [place] RN.

## 2019-12-16 LAB — HELICOBACTER PYLORI  SPECIAL ANTIGEN
MICRO NUMBER:: 10251477
SPECIMEN QUALITY: ADEQUATE

## 2019-12-26 ENCOUNTER — Telehealth: Payer: Self-pay | Admitting: Podiatry

## 2019-12-26 ENCOUNTER — Encounter: Payer: Self-pay | Admitting: Podiatry

## 2019-12-26 ENCOUNTER — Ambulatory Visit: Payer: 59 | Admitting: Podiatry

## 2019-12-26 ENCOUNTER — Other Ambulatory Visit: Payer: Self-pay

## 2019-12-26 ENCOUNTER — Ambulatory Visit (INDEPENDENT_AMBULATORY_CARE_PROVIDER_SITE_OTHER): Payer: 59

## 2019-12-26 VITALS — Temp 98.0°F

## 2019-12-26 DIAGNOSIS — M2580 Other specified joint disorders, unspecified joint: Secondary | ICD-10-CM | POA: Diagnosis not present

## 2019-12-26 DIAGNOSIS — M779 Enthesopathy, unspecified: Secondary | ICD-10-CM

## 2019-12-26 MED ORDER — DICLOFENAC SODIUM 1 % EX GEL: 2.0000 g | Freq: Four times a day (QID) | CUTANEOUS | 2 refills | Status: DC

## 2019-12-26 NOTE — Telephone Encounter (Signed)
Medication that was prescribed today is not covered by pts. Insurance. She was wondering if you could please call In a generic or something else.

## 2019-12-26 NOTE — Telephone Encounter (Signed)
Unable to leave a message the voicemail box is full.

## 2019-12-26 NOTE — Telephone Encounter (Signed)
Please let her know that she can buy the medication over the counter without a prescription. It is called Voltaren or diclofenac gel. I sent it in case they would cover it but if not it is the same medication that's over the counter. Thanks.

## 2019-12-26 NOTE — Telephone Encounter (Signed)
Called pt with benefits for orthotics (they are an exclusion from pts plan) but mailbox is full so I was unable to leave a message. Will try again next week

## 2019-12-26 NOTE — Progress Notes (Signed)
Subjective:    Patient ID: Jennifer Copeland, female    DOB: 24-Oct-1965, 55 y.o.   MRN: [id number]  HPI  54 year old female presents the office today for concerns of pain to the left foot.  She points along the top of her Crohn's first and second MPJ as well as the bunion.  She also points on the sesamoids where she gets the majority discomfort.  This is been gradually getting worse over time.  She also notices in the left foot the smaller toes curling inwards.  The pain is intermittent.  She states it feels stiff.  She states that over the past week it started hurting more with walking.  She usually does about 2 to 3 miles a day of walking.  No recent injury or falls.  No swelling or redness.  Review of Systems  All other systems reviewed and are negative.  Past Medical History:  Diagnosis Date  . Anxiety   . Dermatofibroma   . Hyperthyroidism   . Migraines   . Pneumonia   . Thyrotoxicosis     Past Surgical History:  Procedure Laterality Date  . ANTERIOR CRUCIATE LIGAMENT REPAIR Right    and PCL  . FOOT SURGERY Left      Current Outpatient Medications:  .  Cholecalciferol 50 MCG (2000 UT) CAPS, Take 1 tablet by mouth once a week., Disp: , Rfl:  .  clonazePAM (KLONOPIN) 0.5 MG tablet, Take 0.5 mg by mouth as needed., Disp: , Rfl:  .  hydrocortisone (ANUSOL-HC) 2.5 % rectal cream, Place 1 application rectally 2 (two) times daily., Disp: 30 g, Rfl: 1 .  MELATONIN PO, Take 1 tablet by mouth at bedtime as needed., Disp: , Rfl:  .  methimazole (TAPAZOLE) 5 MG tablet, Take 1 tablet in the morning and 1/2 tablets in the p.m., Disp: 90 tablet, Rfl: 3 .  pantoprazole (PROTONIX) 40 MG tablet, Take once a day for a month for dyspeptic symptoms and epigastric pain, Disp: 30 tablet, Rfl: 1 .  rosuvastatin (CRESTOR) 10 MG tablet, Take 10 mg by mouth at bedtime., Disp: , Rfl:  .  diclofenac Sodium (VOLTAREN) 1 % GEL, Apply 2 g topically 4 (four) times daily. Rub into affected area of foot 2  to 4 times daily, Disp: 100 g, Rfl: 2  Allergies  Allergen Reactions  . Ketorolac Other (See Comments)  . Morphine Nausea And Vomiting         Objective:   Physical Exam  General: AAO x3, NAD  Dermatological: Skin is warm, dry and supple bilateral. Nails x 10 are well manicured; remaining integument appears unremarkable at this time. There are no open sores, no preulcerative lesions, no rash or signs of infection present.  Vascular: Dorsalis Pedis artery and Posterior Tibial artery pedal pulses are 2/4 bilateral with immedate capillary fill time. Pedal hair growth present. No varicosities and no lower extremity edema present bilateral. There is no pain with calf compression, swelling, warmth, erythema.   Neruologic: Grossly intact via light touch bilateral. Protective threshold with Semmes Wienstein monofilament intact to all pedal sites bilateral.   Musculoskeletal: Majority of tenderness is the sesamoid complex as well as the first IPJ of the left foot.  There is no significant edema there is no erythema or warmth.  No area of pinpoint tenderness identified otherwise.  Upon gait evaluation she does seem to be rolling off of her first MPJ.  Mild decrease in medial arch height.  Muscular strength 5/5 in all groups  tested bilateral.  Gait: Unassisted, Nonantalgic.     Assessment & Plan:  Left foot capsulitis/pain  -Treatment options discussed including all alternatives, risks, and complications -Etiology of symptoms were discussed -X-rays ordered and I did independently review them with her.  There is an elongated first metatarsal which is causing jamming of the joint and also due to her gait changes she is putting pressure on the sesamoid complex.  She did start with an over-the-counter inserts were also molded her for custom inserts to offload the first MPJ, sesamoid complex.  Recommend check orthotic coverage.  Prescribed Voltaren gel.  Metatarsal pads were dispensed for  now.  Trula Slade DPM

## 2019-12-29 NOTE — Telephone Encounter (Signed)
Left message requesting call to discuss, Dr. Leigh Aurora recommendation.

## 2020-01-08 ENCOUNTER — Telehealth: Payer: Self-pay | Admitting: Podiatry

## 2020-01-08 NOTE — Telephone Encounter (Signed)
Left message for pt to call to discuss orthotic coverage. Not covered under pts plan.Marland KitchenMarland Kitchen

## 2020-01-09 NOTE — Telephone Encounter (Signed)
Pt returned my call and left message stating she is on vacation but to leave a message with benefit information. She said she is having success with the inserts you recommended from [employer].She just wanted benefits for the future if she needs the custom orthotics.  I returned call and left message that the orthotics are an exclusion from pts plan but to let me if she is not wanting to proceed so I can discard the foam impressions we did.

## 2020-01-23 ENCOUNTER — Other Ambulatory Visit: Payer: Self-pay

## 2020-01-23 ENCOUNTER — Encounter: Payer: Self-pay | Admitting: Internal Medicine

## 2020-01-23 ENCOUNTER — Ambulatory Visit: Payer: 59 | Admitting: Internal Medicine

## 2020-01-23 VITALS — BP 110/70 | HR 55 | Ht 64.75 in | Wt 132.0 lb

## 2020-01-23 DIAGNOSIS — E042 Nontoxic multinodular goiter: Secondary | ICD-10-CM

## 2020-01-23 DIAGNOSIS — E0590 Thyrotoxicosis, unspecified without thyrotoxic crisis or storm: Secondary | ICD-10-CM | POA: Diagnosis not present
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
## 2020-01-23 NOTE — Patient Instructions (Addendum)
Continue Methimazole 5 mg in a.m. and 2.5 mg in p.m.  Please send me a message about ordering the thyroid U/S in June.  Please come back for a follow-up appointment in 6 months.

## 2020-01-30 ENCOUNTER — Encounter: Payer: Self-pay | Admitting: Internal Medicine

## 2020-02-02 ENCOUNTER — Encounter: Payer: Self-pay | Admitting: *Deleted

## 2020-02-03 ENCOUNTER — Other Ambulatory Visit: Payer: Self-pay | Admitting: Internal Medicine

## 2020-02-03 DIAGNOSIS — E042 Nontoxic multinodular goiter: Secondary | ICD-10-CM

## 2020-02-03 DIAGNOSIS — E0590 Thyrotoxicosis, unspecified without thyrotoxic crisis or storm: Secondary | ICD-10-CM

## 2020-02-04 ENCOUNTER — Ambulatory Visit: Payer: 59 | Admitting: Internal Medicine

## 2020-02-04 NOTE — Telephone Encounter (Signed)
Please cancel her appt, do not no show She can return as needed

## 2020-02-17 ENCOUNTER — Other Ambulatory Visit: Payer: Self-pay | Admitting: Internal Medicine

## 2020-05-14 IMAGING — DX DG FOOT COMPLETE 3+V*L*
3 series · 3 of 3 positions shown · non-contrast
Comparison: None.

CLINICAL DATA: Left foot pain for 1 week. No specific injury.

EXAM:
LEFT FOOT - COMPLETE 3+ VIEW

[foot ap]
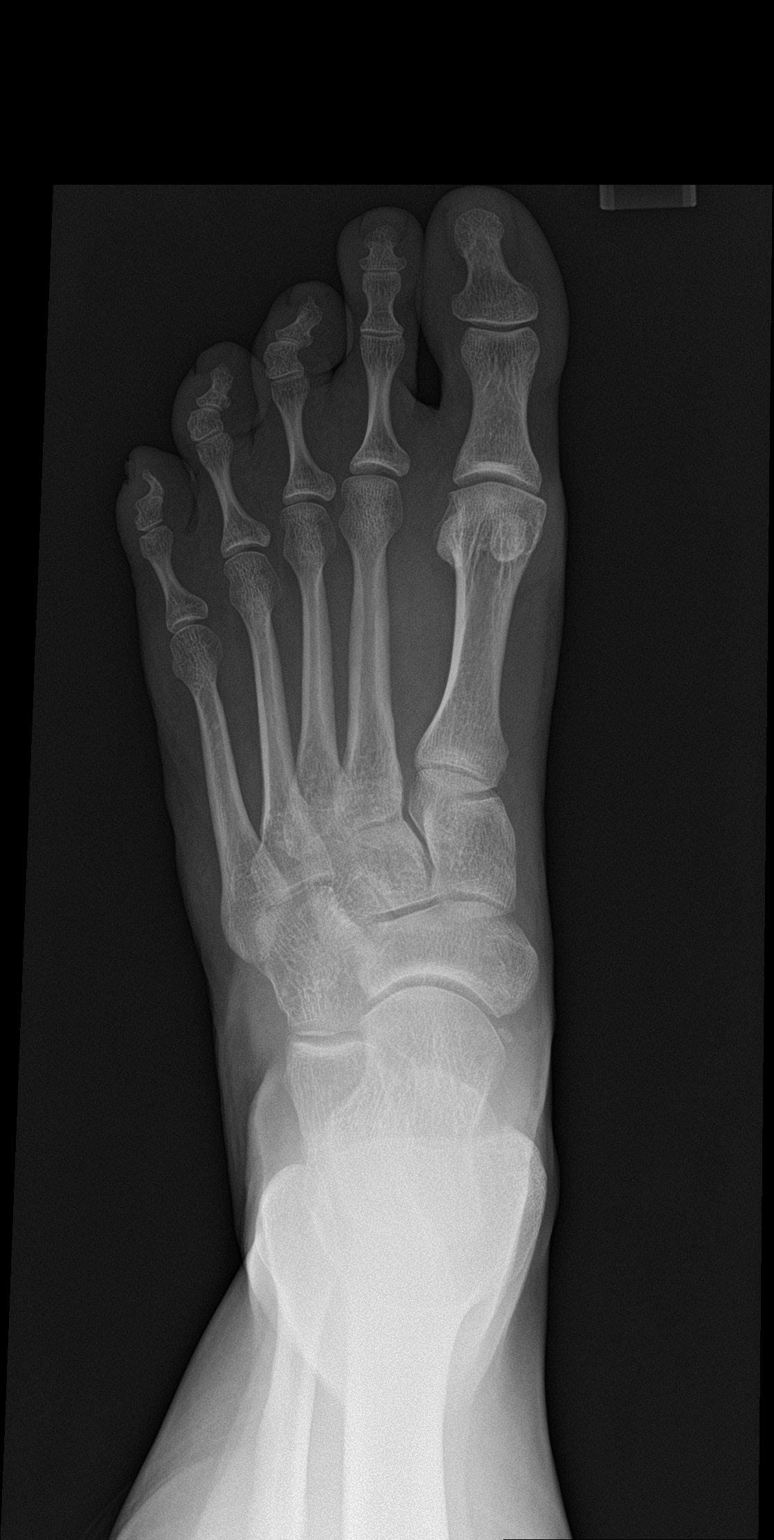

[foot obl]
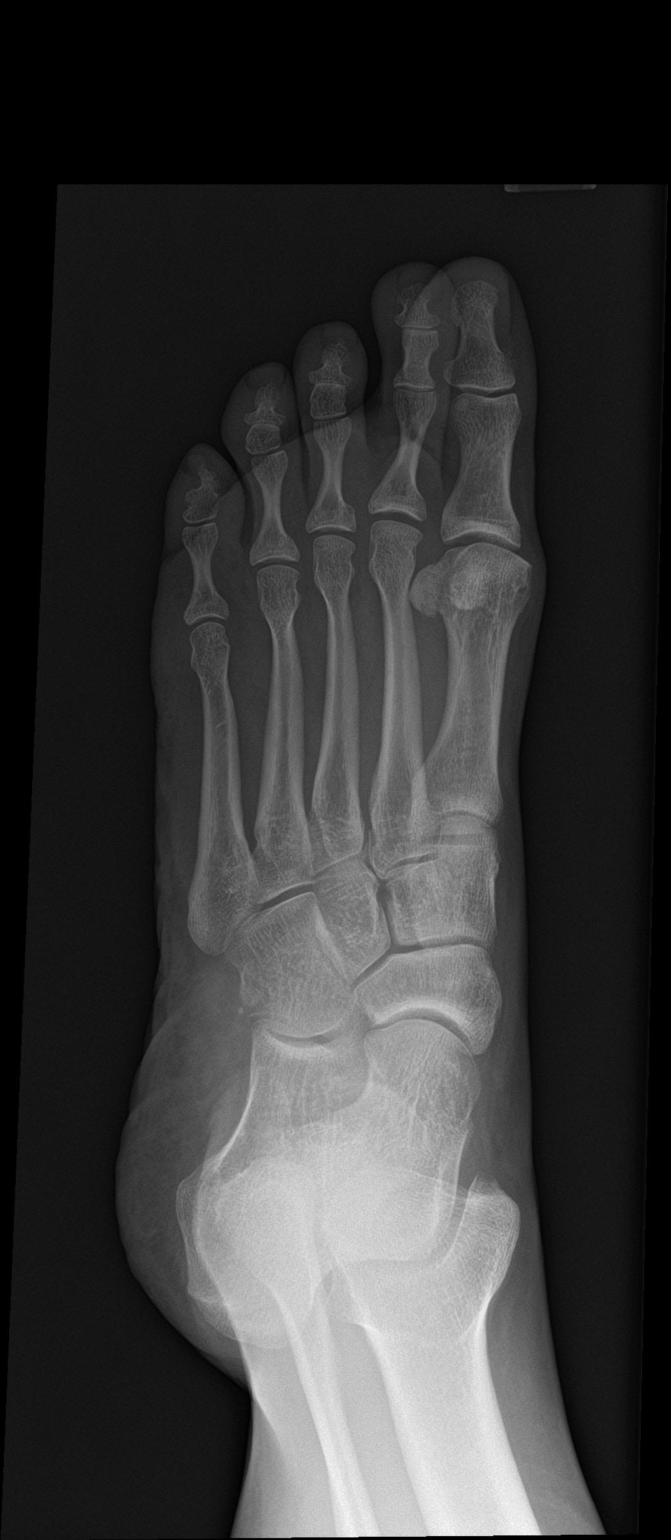

[foot lat]
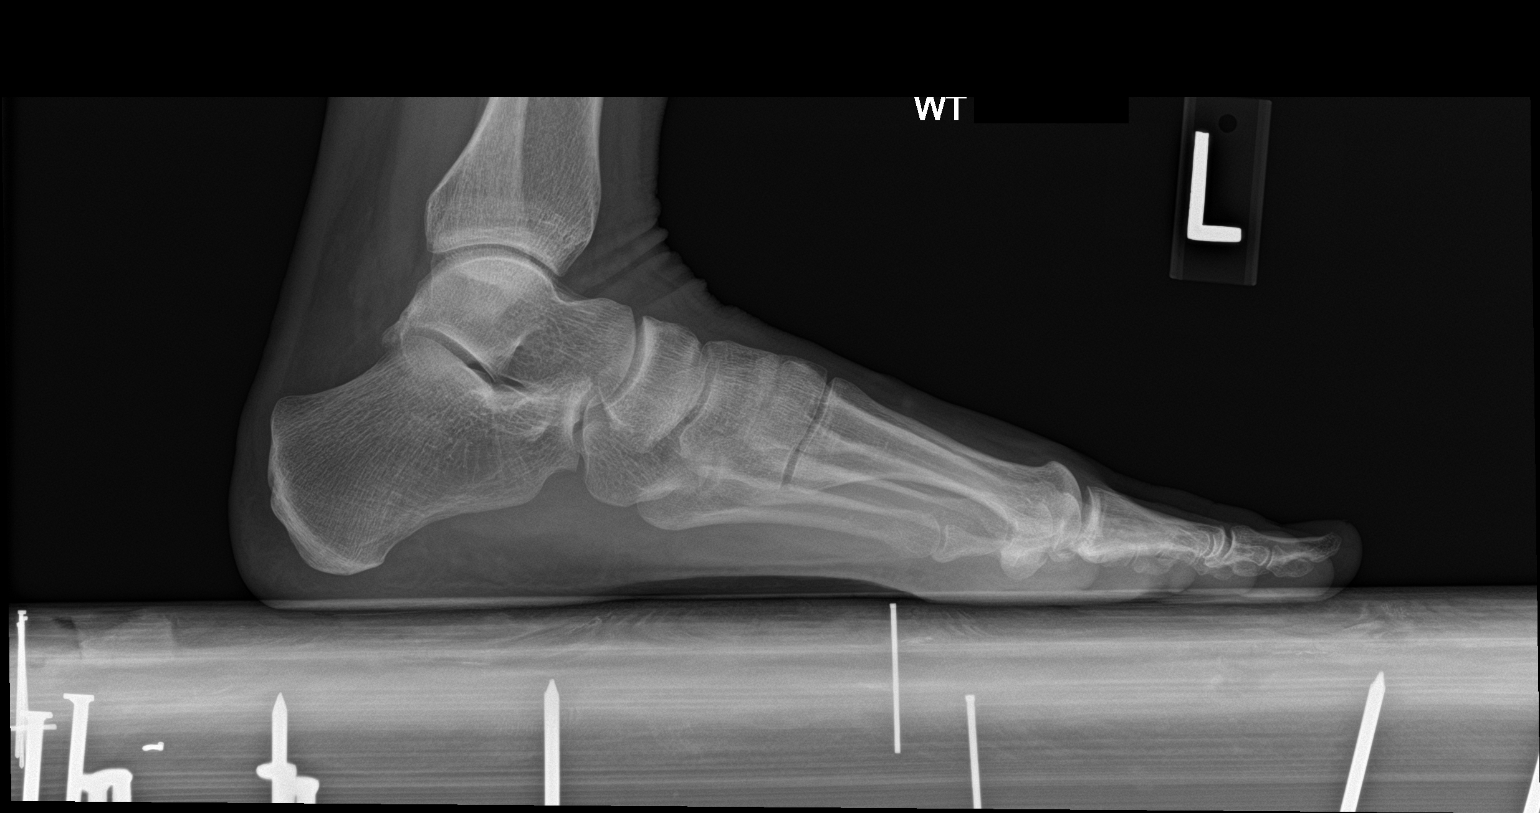

[3 of 3 positions shown; findings below may reference images not displayed]

FINDINGS: The joint spaces are maintained. No significant degenerative
changes. No acute fracture is identified.
IMPRESSION: No acute bony findings or significant degenerative changes.

## 2020-06-09 ENCOUNTER — Other Ambulatory Visit: Payer: Self-pay

## 2020-06-09 ENCOUNTER — Ambulatory Visit (INDEPENDENT_AMBULATORY_CARE_PROVIDER_SITE_OTHER): Payer: 59

## 2020-06-09 ENCOUNTER — Ambulatory Visit: Payer: 59 | Admitting: Emergency Medicine

## 2020-06-09 ENCOUNTER — Encounter: Payer: Self-pay | Admitting: Emergency Medicine

## 2020-06-09 VITALS — BP 132/78 | HR 95 | Temp 96.2°F | Ht 65.0 in | Wt 126.0 lb

## 2020-06-09 DIAGNOSIS — R0600 Dyspnea, unspecified: Secondary | ICD-10-CM

## 2020-06-09 DIAGNOSIS — R072 Precordial pain: Secondary | ICD-10-CM

## 2020-06-09 DIAGNOSIS — R0602 Shortness of breath: Secondary | ICD-10-CM | POA: Diagnosis not present

## 2020-06-09 DIAGNOSIS — R911 Solitary pulmonary nodule: Secondary | ICD-10-CM

## 2020-06-09 DIAGNOSIS — R079 Chest pain, unspecified: Secondary | ICD-10-CM | POA: Insufficient documentation

## 2020-06-09 DIAGNOSIS — U071 COVID-19: Secondary | ICD-10-CM | POA: Diagnosis not present

## 2020-06-09 NOTE — Assessment & Plan Note (Signed)
As above need to evaluate for evolving airflow obstruction following Covid, possible pneumonitis or interstitial disease.  Work-up as above.

## 2020-06-09 NOTE — Telephone Encounter (Signed)
We discussed her chest x-ray results from 06/09/2020.  Interstitium is overall reassuring.  I do not see any evidence of residual pneumonitis or interstitial disease.  There is a possible left apical opacity, question confluence of shadows from bony structures. Most definitive way to evaluate is with a CT without contrast and she agrees.  We will arrange for this.

## 2020-06-09 NOTE — Assessment & Plan Note (Signed)
Substernal chest discomfort, could be related to Covid but because it is worse when she is supine question whether this could be GERD.  She is going to do an empiric trial of omeprazole to see if it changes.  Depending on this, the PFT and chest x-ray results we may decide to refer for a cardiology evaluation.

## 2020-06-09 NOTE — Progress Notes (Signed)
Subjective:    Patient ID: Jennifer Copeland, female    DOB: 18-Apr-1966, 54 y.o.   MRN: [id number]  HPI Pleasant 54 year old woman with a history of hyperthyroidism with thyroid nodules (under evaluation), hyperlipidemia, never smoker and otherwise healthy. She may have had some asthma type symptoms with prior URI's, never officially diagnosed. She was diagnosed with COVID 19 on 05/19/20.   With COVID she initially dealt with URI sx, congestion, HA, fever. A lot of the sx improved but she has significant fatigue. She feels a "brain fog". Some HA but less than initially. She is now experiencing chest tightness, some mid to L substernal pain, seems to happen at times of more activity. For the last 2 evenings she has had some chest pain that woke her from sleep. Lasted about an hour. No sour taste associated with this. Overall her functional capacity is improving but she is not near her usual active baseline.    Review of Systems As per HPI  Past Medical History:  Diagnosis Date  . Anxiety   . Dermatofibroma   . Diverticulosis   . Hyperthyroidism   . Internal hemorrhoids   . Migraines   . Pneumonia   . Thyrotoxicosis      Family History  Problem Relation Age of Onset  . Breast cancer Mother   . Barrett's esophagus Mother   . Macular degeneration Mother   . Rheum arthritis Mother   . Atrial fibrillation Mother   . Anxiety disorder Mother   . Depression Mother   . Fibromyalgia Mother   . COPD Mother   . Emphysema Father   . Melanoma Brother      Social History   Socioeconomic History  . Marital status: Married    Spouse name: Not on file  . Number of children: 3  . Years of education: Not on file  . Highest education level: Not on file  Occupational History  . Occupation: [employer]   Tobacco Use  . Smoking status: Never Smoker  . Smokeless tobacco: Never Used  Vaping Use  . Vaping Use: Never used  Substance and Sexual Activity  . Alcohol use: Yes    Comment: 1  per week  . Drug use: Never  . Sexual activity: Not on file  Other Topics Concern  . Not on file  Social History Narrative  . Not on file   Social Determinants of Health   Financial Resource Strain:   . Difficulty of Paying Living Expenses: Not on file  Food Insecurity:   . Worried About Charity fundraiser in the Last Year: Not on file  . Ran Out of Food in the Last Year: Not on file  Transportation Needs:   . Lack of Transportation (Medical): Not on file  . Lack of Transportation (Non-Medical): Not on file  Physical Activity:   . Days of Exercise per Week: Not on file  . Minutes of Exercise per Session: Not on file  Stress:   . Feeling of Stress : Not on file  Social Connections:   . Frequency of Communication with Friends and Family: Not on file  . Frequency of Social Gatherings with Friends and Family: Not on file  . Attends Religious Services: Not on file  . Active Member of Clubs or Organizations: Not on file  . Attends Archivist Meetings: Not on file  . Marital Status: Not on file  Intimate Partner Violence:   . Fear of Current or Ex-Partner: Not on  file  . Emotionally Abused: Not on file  . Physically Abused: Not on file  . Sexually Abused: Not on file     Allergies  Allergen Reactions  . Ketorolac Other (See Comments)  . Morphine Nausea And Vomiting     Outpatient Medications Prior to Visit  Medication Sig Dispense Refill  . Cholecalciferol 50 MCG (2000 UT) CAPS Take 1 tablet by mouth once a week.    . clonazePAM (KLONOPIN) 0.5 MG tablet Take 0.5 mg by mouth as needed.    . diclofenac Sodium (VOLTAREN) 1 % GEL Apply 2 g topically 4 (four) times daily. Rub into affected area of foot 2 to 4 times daily 100 g 2  . hydrocortisone (ANUSOL-HC) 2.5 % rectal cream Place 1 application rectally 2 (two) times daily. 30 g 1  . MELATONIN PO Take 1 tablet by mouth at bedtime as needed.    . methimazole (TAPAZOLE) 5 MG tablet Take 1 tablet in the morning and 1/2  tablets in the p.m. 90 tablet 3  . pantoprazole (PROTONIX) 40 MG tablet Take once a day for a month for dyspeptic symptoms and epigastric pain 30 tablet 1  . rosuvastatin (CRESTOR) 10 MG tablet Take 10 mg by mouth at bedtime.     No facility-administered medications prior to visit.         Objective:   Physical Exam  Vitals:   06/09/20 1159  BP: 132/78  Pulse: 95  Temp: (!) 96.2 F (35.7 C)  TempSrc: Temporal  SpO2: 99%  Weight: 126 lb (57.2 kg)  Height: 5\' 5"  (1.651 m)   Gen: Pleasant, well-nourished, in no distress,  normal affect  ENT: No lesions,  mouth clear,  oropharynx clear, no postnasal drip  Neck: No JVD, no stridor  Lungs: No use of accessory muscles, no crackles or wheezing on normal respiration, no wheeze on forced expiration  Cardiovascular: RRR, heart sounds normal, no murmur or gallops, no peripheral edema  Musculoskeletal: No deformities, no cyanosis or clubbing  Neuro: alert, awake, non focal  Skin: Warm, no lesions or rash     Assessment & Plan:  COVID-19 virus infection Treated as an outpt, isolated as recommended.  Brief course of steroids after she had persistent symptoms.  At risk for evolving obstructive lung disease in the aftermath of this illness.  Also at risk for pneumonitis or even interstitial disease.  Chest x-ray today and pulmonary function testing ordered.  Chest pain Substernal chest discomfort, could be related to Covid but because it is worse when she is supine question whether this could be GERD.  She is going to do an empiric trial of omeprazole to see if it changes.  Depending on this, the PFT and chest x-ray results we may decide to refer for a cardiology evaluation.  Dyspnea As above need to evaluate for evolving airflow obstruction following Covid, possible pneumonitis or interstitial disease.  Work-up as above.  Baltazar Apo, MD, PhD 06/09/2020, 3:23 PM [facility] [phone] or if no answer  [phone]

## 2020-06-09 NOTE — Addendum Note (Signed)
Addended by: Tery Sanfilippo R on: 06/09/2020 03:42 PM   Modules accepted: Orders

## 2020-06-09 NOTE — Assessment & Plan Note (Signed)
Treated as an outpt, isolated as recommended.  Brief course of steroids after she had persistent symptoms.  At risk for evolving obstructive lung disease in the aftermath of this illness.  Also at risk for pneumonitis or even interstitial disease.  Chest x-ray today and pulmonary function testing ordered.

## 2020-06-09 NOTE — Patient Instructions (Signed)
Chest x-ray today We will arrange for pulmonary function testing in next office visit Keep albuterol available to use 2 puffs if needed for shortness of breath, chest tightness. Try starting omeprazole 20 mg once daily, take 1 hour before dinner.  Keep track of whether this helps your chest discomfort Follow with Dr Lamonte Sakai in 1 month or next available with full PFT on the same day

## 2020-06-09 NOTE — Telephone Encounter (Signed)
We can go ahead and schedule. Not sure when you are looking to schedule. I'm open all day on Thursday Sept 16 and all day Friday Sept 17 if you are scheduling next week. The following week I'm open all day Monday Sept 20th and Tuesday Sept 21 until 1:00 or anytime Thursday Sept 23. Just let me know.    [facility] please advise patient would like to schedule her CT

## 2020-06-11 NOTE — Telephone Encounter (Signed)
Helen at [employer] in [place] tried to call pt this morning to schedule CT and she left a vm for pt to call her back.  Will route message back to triage so they can make pt aware thru [place].

## 2020-06-11 NOTE — Telephone Encounter (Signed)
Patient was scheduled and the authorization has been corrected to read med center [facility]

## 2020-06-17 ENCOUNTER — Ambulatory Visit (HOSPITAL_BASED_OUTPATIENT_CLINIC_OR_DEPARTMENT_OTHER): Admission: RE | Admit: 2020-06-17 | Payer: 59 | Source: Ambulatory Visit

## 2020-06-23 ENCOUNTER — Institutional Professional Consult (permissible substitution): Payer: 59 | Admitting: Emergency Medicine

## 2020-06-26 ENCOUNTER — Other Ambulatory Visit: Payer: Self-pay | Admitting: Internal Medicine

## 2020-07-13 ENCOUNTER — Ambulatory Visit: Payer: 59 | Admitting: Emergency Medicine

## 2020-07-14 ENCOUNTER — Ambulatory Visit (INDEPENDENT_AMBULATORY_CARE_PROVIDER_SITE_OTHER): Payer: 59 | Admitting: Emergency Medicine

## 2020-07-14 ENCOUNTER — Ambulatory Visit: Payer: 59 | Admitting: Emergency Medicine

## 2020-07-14 ENCOUNTER — Encounter: Payer: Self-pay | Admitting: Emergency Medicine

## 2020-07-14 ENCOUNTER — Other Ambulatory Visit: Payer: Self-pay

## 2020-07-14 ENCOUNTER — Ambulatory Visit (INDEPENDENT_AMBULATORY_CARE_PROVIDER_SITE_OTHER): Payer: 59

## 2020-07-14 VITALS — BP 118/66 | HR 75 | Temp 98.3°F | Ht 64.75 in | Wt 128.4 lb

## 2020-07-14 DIAGNOSIS — R0602 Shortness of breath: Secondary | ICD-10-CM

## 2020-07-14 DIAGNOSIS — U071 COVID-19: Secondary | ICD-10-CM | POA: Diagnosis not present

## 2020-07-14 DIAGNOSIS — R9389 Abnormal findings on diagnostic imaging of other specified body structures: Secondary | ICD-10-CM | POA: Diagnosis not present

## 2020-07-14 DIAGNOSIS — R0600 Dyspnea, unspecified: Secondary | ICD-10-CM

## 2020-07-14 LAB — PULMONARY FUNCTION TEST
DL/VA % pred: 106 %
DL/VA: 4.56 ml/min/mmHg/L
DLCO cor % pred: 100 %
DLCO cor: 21.54 ml/min/mmHg
DLCO unc % pred: 100 %
DLCO unc: 21.54 ml/min/mmHg
FEF 25-75 Post: 2.07 L/sec
FEF 25-75 Pre: 1.86 L/sec
FEF2575-%Change-Post: 11 %
FEF2575-%Pred-Post: 75 %
FEF2575-%Pred-Pre: 67 %
FEV1-%Change-Post: 3 %
FEV1-%Pred-Post: 102 %
FEV1-%Pred-Pre: 98 %
FEV1-Post: 2.92 L
FEV1-Pre: 2.82 L
FEV1FVC-%Change-Post: 6 %
FEV1FVC-%Pred-Pre: 89 %
FEV6-%Change-Post: -1 %
FEV6-%Pred-Post: 109 %
FEV6-%Pred-Pre: 111 %
FEV6-Post: 3.85 L
FEV6-Pre: 3.91 L
FEV6FVC-%Change-Post: 1 %
FEV6FVC-%Pred-Post: 102 %
FEV6FVC-%Pred-Pre: 101 %
FVC-%Change-Post: -2 %
FVC-%Pred-Post: 106 %
FVC-%Pred-Pre: 109 %
FVC-Post: 3.85 L
FVC-Pre: 3.95 L
Post FEV1/FVC ratio: 76 %
Post FEV6/FVC ratio: 100 %
Pre FEV1/FVC ratio: 71 %
Pre FEV6/FVC Ratio: 99 %
RV % pred: 123 %
RV: 2.28 L
TLC % pred: 109 %
TLC: 5.68 L

## 2020-07-14 NOTE — Progress Notes (Signed)
PFT done today. 

## 2020-07-14 NOTE — Patient Instructions (Signed)
We will repeat your chest x-ray today Your pulmonary function testing was overall reassuring.  Your airflows suggest possible tendency towards some mild asthma. Try using your albuterol 2 puffs prior to exercise or if you have shortness of breath to see if this gives you any improvement.  Keep track of whether it helps. Continue to slowly push your exercise tolerance. Follow with Dr Lamonte Sakai in 4 months or sooner if you have any problems.

## 2020-07-14 NOTE — Assessment & Plan Note (Signed)
Left apical opacity of unclear significance noted on her chest x-ray done last visit.  I do not see any evidence of interstitial disease, pneumonitis, pneumonia left over from Covid.  Question whether there may be a left apical nodule.  I will repeat her chest x-ray today.  If the opacity persists then we likely need to perform CT chest to clarify.

## 2020-07-14 NOTE — Progress Notes (Signed)
Subjective:    Patient ID: Jennifer Copeland, female    DOB: Sep 18, 1966, 54 y.o.   MRN: [id number]  HPI Pleasant 54 year old woman with a history of hyperthyroidism with thyroid nodules (under evaluation), hyperlipidemia, never smoker and otherwise healthy. She may have had some asthma type symptoms with prior URI's, never officially diagnosed. She was diagnosed with COVID 19 on 05/19/20.   With COVID she initially dealt with URI sx, congestion, HA, fever. A lot of the sx improved but she has significant fatigue. She feels a "brain fog". Some HA but less than initially. She is now experiencing chest tightness, some mid to L substernal pain, seems to happen at times of more activity. For the last 2 evenings she has had some chest pain that woke her from sleep. Lasted about an hour. No sour taste associated with this. Overall her functional capacity is improving but she is not near her usual active baseline.   ROV 07/14/2020 --this follow-up visit for 54 year old never smoker with hypothyroidism/thyroid nodules, hyperlipidemia, question asthma type syndrome in the past with URI.  She had COVID-19 pneumonia diagnosed 05/19/2020.  In the aftermath of that she had persistent chest tightness, some substernal discomfort that seem to correlate with activity.  Had not returned to her prior baseline with regard to exertional tolerance, seemed to keep a "brain fog".  She did not appear trial of omeprazole since last visit, reports.  Overall she is a bit better, but still feels some exertional dyspnea, chest tightness. She has discovered that there may be a mold exposure at her office - it is being cleaned up.  Chest x-ray did not show any evidence for interstitial disease or pulmonary infiltrates.  There was question of a right upper lobe 1.3 cm rounded density of unclear etiology.  We have not yet performed a CT chest.   Review of Systems As per HPI      Objective:   Physical Exam  Vitals:   07/14/20  1403  BP: 118/66  Pulse: 75  Temp: 98.3 F (36.8 C)  TempSrc: Oral  SpO2: 98%  Weight: 128 lb 6.4 oz (58.2 kg)  Height: 5' 4.75" (1.645 m)   Gen: Pleasant, well-nourished, in no distress,  normal affect  ENT: No lesions,  mouth clear,  oropharynx clear, no postnasal drip  Neck: No JVD, no stridor  Lungs: No use of accessory muscles, no crackles or wheezing on normal respiration, no wheeze on forced expiration  Cardiovascular: RRR, heart sounds normal, no murmur or gallops, no peripheral edema  Musculoskeletal: No deformities, no cyanosis or clubbing  Neuro: alert, awake, non focal  Skin: Warm, no lesions or rash     Assessment & Plan:  COVID-19 virus infection Tolerance and breathing are improving but she still is not back to her usual baseline.  Pulmonary function testing overall reassuring today but with some evidence for mild obstruction without a bronchodilator response.  I do not think she needs scheduled bronchodilator therapy at this time, expect continued improvement as we go forward.  Abnormal chest x-ray Left apical opacity of unclear significance noted on her chest x-ray done last visit.  I do not see any evidence of interstitial disease, pneumonitis, pneumonia left over from Covid.  Question whether there may be a left apical nodule.  I will repeat her chest x-ray today.  If the opacity persists then we likely need to perform CT chest to clarify.  Baltazar Apo, MD, PhD 07/14/2020, 5:22 PM [facility]  [phone] or if no answer [phone]

## 2020-07-14 NOTE — Assessment & Plan Note (Signed)
Tolerance and breathing are improving but she still is not back to her usual baseline.  Pulmonary function testing overall reassuring today but with some evidence for mild obstruction without a bronchodilator response.  I do not think she needs scheduled bronchodilator therapy at this time, expect continued improvement as we go forward.

## 2020-07-23 ENCOUNTER — Ambulatory Visit: Payer: 59 | Admitting: Internal Medicine

## 2020-08-01 ENCOUNTER — Encounter: Payer: Self-pay | Admitting: Internal Medicine

## 2020-08-02 NOTE — Telephone Encounter (Signed)
Dr. Byrum, please see mychart message sent by pt and advise. 

## 2020-08-04 ENCOUNTER — Telehealth: Payer: Self-pay

## 2020-08-04 NOTE — Telephone Encounter (Signed)
Received blood work- will give to MD to have for appointment tomorrow.

## 2020-08-04 NOTE — Telephone Encounter (Signed)
I called [facility] at [place] to get the lab work for patient- had to leave a message. Will try later.

## 2020-08-05 ENCOUNTER — Other Ambulatory Visit: Payer: Self-pay

## 2020-08-05 ENCOUNTER — Encounter: Payer: Self-pay | Admitting: Internal Medicine

## 2020-08-05 ENCOUNTER — Ambulatory Visit (INDEPENDENT_AMBULATORY_CARE_PROVIDER_SITE_OTHER): Payer: 59 | Admitting: Internal Medicine

## 2020-08-05 VITALS — BP 118/62 | HR 61 | Ht 64.0 in | Wt 134.8 lb

## 2020-08-05 DIAGNOSIS — E0590 Thyrotoxicosis, unspecified without thyrotoxic crisis or storm: Secondary | ICD-10-CM

## 2020-08-05 DIAGNOSIS — E042 Nontoxic multinodular goiter: Secondary | ICD-10-CM

## 2020-08-05 MED ORDER — METHIMAZOLE 5 MG PO TABS
ORAL_TABLET | ORAL | 3 refills | Status: DC
Start: 2020-08-05 — End: 2020-12-09
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
## 2020-08-05 NOTE — Patient Instructions (Signed)
Please have another set of labs in 5 weeks.  Please increase Methimazole to 5 mg 2x a day.  Stop iodine.  Please come back for a follow-up appointment in 6 months.   Radioiodine (I-131) Therapy for Hyperthyroidism Radioiodine (I-131) therapy is a treatment for an overactive thyroid gland (hyperthyroidism). The thyroid is a gland in the neck that uses iodine to help control how the body uses food (metabolism). This treatment involves swallowing a pill or liquid that contains I-131. I-131 is manufactured (synthetic) iodine that gives off radiation. After it is swallowed, the I-131 will be absorbed by the thyroid gland over the next few months. It will destroy thyroid cells and reverse hyperthyroidism. Tell a health care provider about:  Any allergies you have.  All medicines you are taking, including vitamins, herbs, eye drops, creams, and over-the-counter medicines.  Any blood disorders you have.  Any surgeries you have had.  Any medical conditions you have.  Whether you are pregnant, may be pregnant, or have gone through menopause, if this applies.  Whether you currently have children.  Whether you are breastfeeding.  Whether you plan to have children in the next 2 years.  Any contact you have with children or pregnant women.  Your travel plans for the next 3 months.  Whether you pass through radiation detectors for work or travel. What are the risks? Generally, this is a safe procedure. However, problems may occur, including:  Damage to other structures or organs, such as the salivary glands. This could lead to dry mouth and loss of taste.  Low sperm count, if this applies. This may lead to temporary infertility.  Sore throat or neck pain. This is temporary.  Slightly increased risk of thyroid cancer.  Nausea or vomiting. What happens before the procedure? Staying hydrated  Follow instructions from your health care provider about hydration, which may include: ? Up  to 2 hours before the procedure - you may continue to drink clear liquids, such as water, clear fruit juice, black coffee, and plain tea. Eating and drinking restrictions  Follow instructions from your health care provider about eating and drinking restrictions.  Follow a low-iodine diet as told by your health care provider. Check ingredients on packaged foods and drinks because there are foods that you will need to avoid while on the low-iodine diet: ? Avoid iodized table salt and foods that have iodized salt. ? Avoid seafood, seaweed, soybeans, and soy products. ? Avoid dairy products and eggs. ? Avoid the food dye Red No. 3 because it has iodine. Medicines   Ask your health care provider about: ? Changing or stopping your regular medicines. This is especially important if you are taking diabetes medicines, blood thinners, or thyroid medicines. ? Taking over-the-counter medicines, vitamins, herbs, and supplements. General instructions  Women may be asked to take a pregnancy test.  Women who are breastfeeding should: ? Plan to stop at least 6 weeks before the procedure. ? Not go back to breastfeeding after the procedure until their health care provider approves.  Plan to avoid contact with other people for 1 week after your treatment. Avoiding contact with children and pregnant women is especially important. To do this, plan to stay home from work, arrange child care, and sleep alone, if these things apply to you.  Plan to drive yourself home after treatment. Do not take public transportation. If you need someone to drive you home, sit as far away from the driver as possible. What happens during the procedure?  You will be given a dose of I-131 to swallow. It may be a pill or a liquid.  Your thyroid gland will absorb the I-131 over the next 3 months. The treatment process will be complete in about 6 months. What happens after the procedure?  You may need to stay in the hospital for  24 hours after your treatment. This depends on the requirements in your state.  Follow instructions from your health care provider about: ? How to take care of yourself after the procedure. ? How to protect others from exposure to radiation as it leaves your body. Summary  Radioiodine (I-131) therapy is a treatment for an overactive thyroid gland (hyperthyroidism).  This treatment involves swallowing a pill or liquid that contains I-131. I-131 is manufactured iodine that gives off radiation.  Your thyroid gland will absorb the I-131 over the next 3 months. The I-131 destroys thyroid cells and reverses hyperthyroidism.  Follow instructions from your health care provider about how to take care of yourself and how to protect other people from exposure to radiation after the procedure. This information is not intended to replace advice given to you by your health care provider. Make sure you discuss any questions you have with your health care provider. Document Revised: 10/31/2018 Document Reviewed: 10/31/2018 Elsevier Patient Education  [employer].

## 2020-09-03 ENCOUNTER — Telehealth: Payer: Self-pay | Admitting: Internal Medicine

## 2020-09-03 MED ORDER — TRULANCE 3 MG PO TABS: 3.0000 mg | ORAL_TABLET | Freq: Every day | ORAL | 3 refills | Status: DC

## 2020-09-03 NOTE — Telephone Encounter (Signed)
Spoke with pt and she is aware. Script sent to pharmacy. 

## 2020-09-03 NOTE — Telephone Encounter (Signed)
Pt is requesting a call back from a nurse to discuss her ongoing constipation issues, pt would like some advice on what she can do.

## 2020-09-03 NOTE — Telephone Encounter (Signed)
Pt states she has had issues with constipation her whole life. She states she recently had an xray in urgent care and was told there was large stool burden. She takes miralax one dose daily and only has a BM about every 4-5 days. Pt would like a prescription for something to help with constipation. Please advise.

## 2020-09-03 NOTE — Telephone Encounter (Signed)
Would have her try Trulance 3 mg once daily This is intended for every day use She should let me know if she has side effects such as frequent diarrhea or if this is ineffective in controlling her constipation

## 2020-09-15 ENCOUNTER — Telehealth: Payer: Self-pay | Admitting: Internal Medicine

## 2020-09-15 NOTE — Telephone Encounter (Signed)
09/15/2020 Jennifer Copeland, [place] [address]  Plan member ID: [id number] Case number: [id number] Prescriber name: Jennifer Jarred Prescriber fax: [phone]  Jennifer Copeland  Dear Jennifer Copeland,  On behalf of [employer], [employer] is responsible for reviewing pharmacy services provided to [facility] members. We received a request from your prescriber for coverage of Trulance Tab 3mg .  We reviewed all of the information you and/or your doctor sent to [place] and sent the information to an appropriate physician specialist if needed. Unfortunately, we must deny coverage for Trulance.  Why was my request denied? This request was denied because you did not meet the following clinical requirements: Based on the information provided, you do not meet the established medication-specific criteria or guidelines for Trulance at this time. The request for coverage for TRULANCE TAB 3MG , use as directed (30 per month), is denied. This decision is based on health plan criteria for TRULANCE TAB 3MG . This medicine is covered only if:  You have failed or cannot use Linzess*.  The information provided does not show that you meet the criteria listed above. *Please note: This product may require prior authorization. The reason(s) [employer] did not approve this medication can be found above. This denial is based on our Trulance drug coverage policy, in addition to any supplementary information you or your prescriber may have submitted.

## 2020-09-15 NOTE — Telephone Encounter (Signed)
Dr Hilarie Fredrickson, patient's Trulance has been denied as she has not tried and failed Linzess before. Please advise.Marland KitchenMarland KitchenMarland Kitchen

## 2020-09-15 NOTE — Telephone Encounter (Signed)
I have contacted patient's pharmacy who tells me that patient's Trulance actually needs prior authorization. I have attempted prior authorization via [email or web address]. We are awaiting response.

## 2020-09-16 MED ORDER — LINACLOTIDE 145 MCG PO CAPS: 145.0000 ug | ORAL_CAPSULE | Freq: Every day | ORAL | 11 refills | Status: AC

## 2020-09-16 NOTE — Telephone Encounter (Signed)
Prescription sent to patient's pharmacy. Informed patient to call our office back if it is ineffective or having diarrhea. Patient verbalized understanding.

## 2020-09-16 NOTE — Telephone Encounter (Signed)
Left message for patient to return my call.

## 2020-09-16 NOTE — Telephone Encounter (Signed)
Pt is requesting a call back (missed call) 

## 2020-09-16 NOTE — Telephone Encounter (Signed)
Ok. Let's try Linzess 145 mcg daily.  Best 30 min before 1st meal of the day Call if ineffective or having diarrhea Goal is complete and spontaneous BMs without uncontrollable diarrhea

## 2020-12-01 IMAGING — DX DG CHEST 2V
2 series · 2 of 2 positions shown · non-contrast
Comparison: Chest x-ray 06/09/2020.

CLINICAL DATA: 53-year-old female with history of shortness of
breath. COVID positive patient.

EXAM:
CHEST - 2 VIEW

[chest pa]
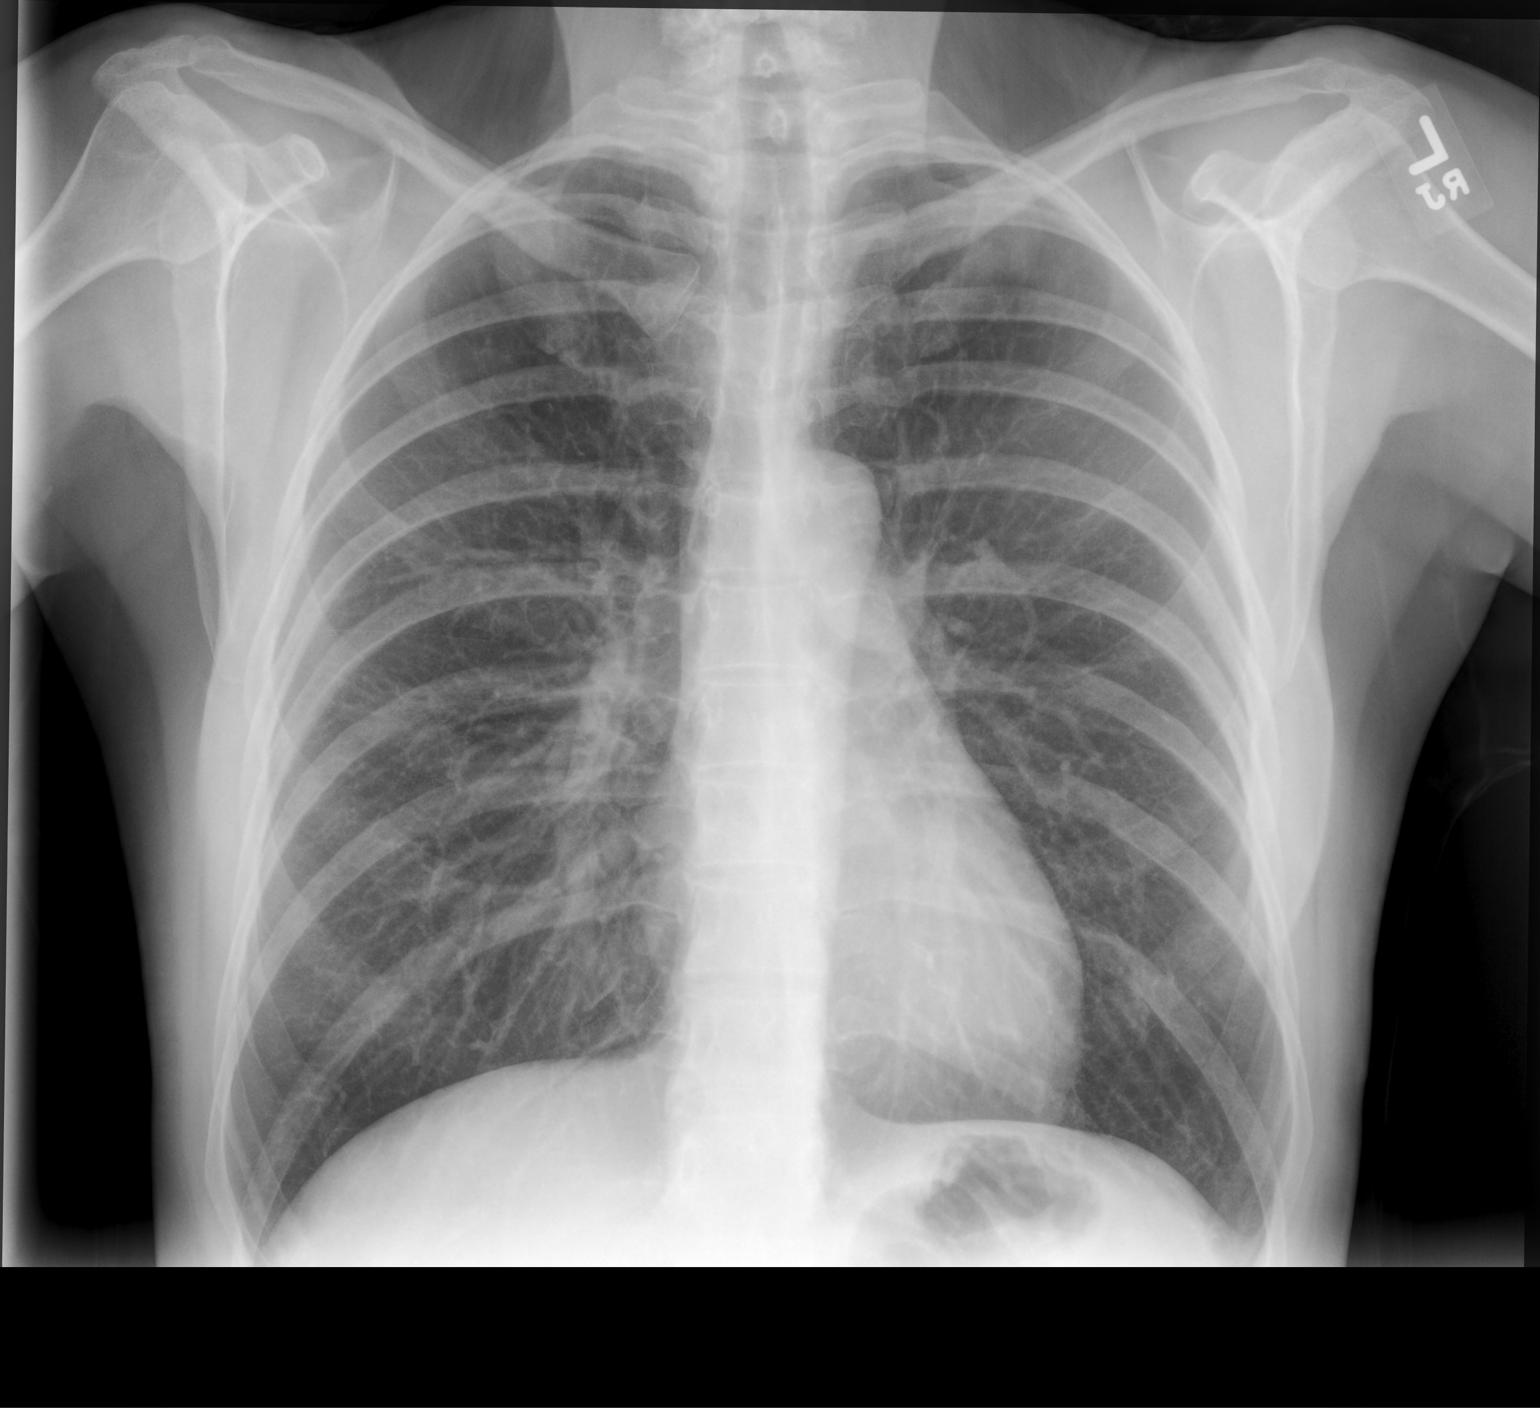

[chest lat]
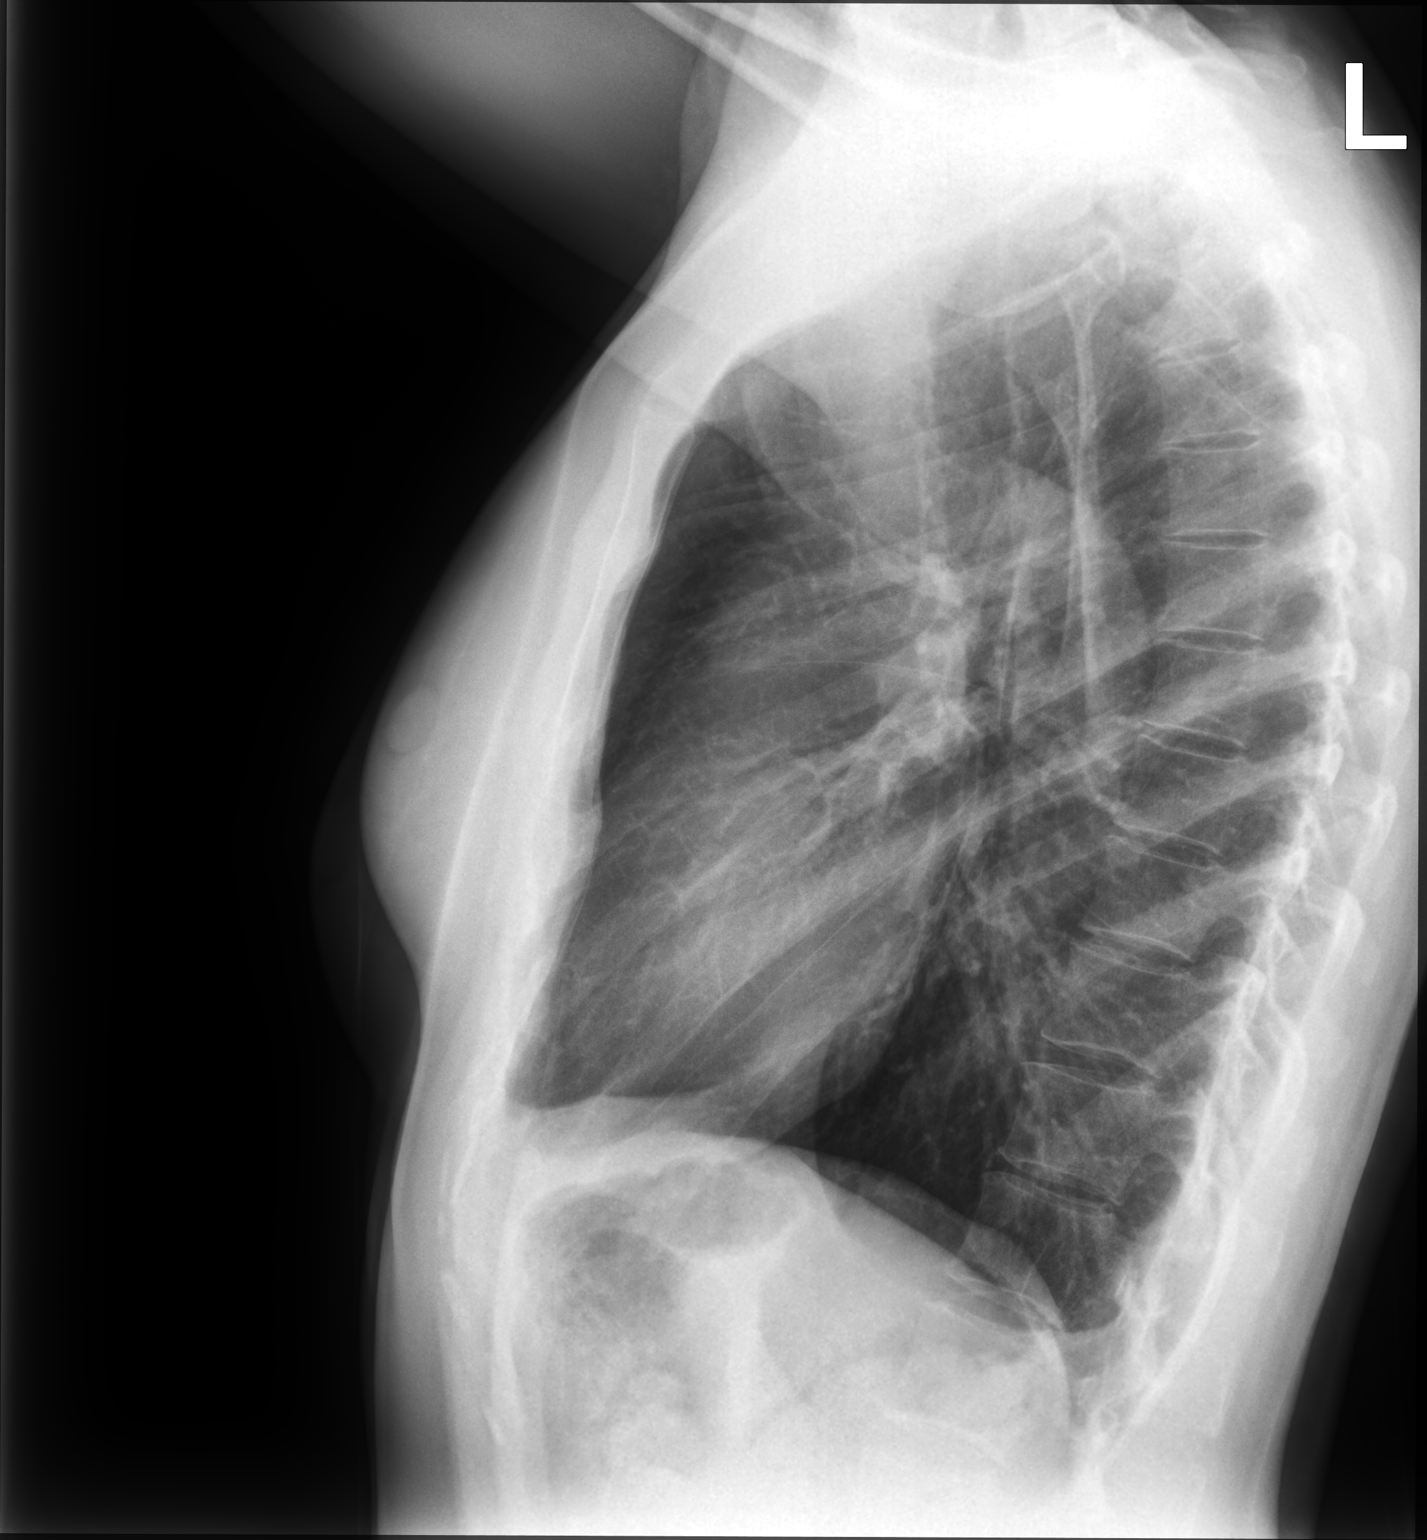

[2 of 2 positions shown; findings below may reference images not displayed]

FINDINGS: Lung volumes are normal. No consolidative airspace disease. No
pleural effusions. No pneumothorax. No pulmonary nodule or mass
noted. Pulmonary vasculature and the cardiomediastinal silhouette
are within normal limits.
IMPRESSION: No radiographic evidence of acute cardiopulmonary disease.
Additionally, previously noted nodular density projecting over the
lateral aspect of the left upper lobe near the apex is not
confidently identified on today's follow-up examination.

## 2020-12-09 ENCOUNTER — Encounter: Payer: Self-pay | Admitting: Internal Medicine

## 2020-12-09 ENCOUNTER — Other Ambulatory Visit: Payer: Self-pay | Admitting: Internal Medicine

## 2020-12-09 DIAGNOSIS — E0590 Thyrotoxicosis, unspecified without thyrotoxic crisis or storm: Secondary | ICD-10-CM

## 2020-12-09 MED ORDER — METHIMAZOLE 5 MG PO TABS: ORAL_TABLET | ORAL | 3 refills | Status: DC

## 2021-03-21 ENCOUNTER — Encounter: Payer: Self-pay | Admitting: Internal Medicine

## 2021-05-05 NOTE — Progress Notes (Signed)
Office Visit Note  Patient: Jennifer Copeland             Date of Birth: [birth year]           MRN: [id number]             PCP: Elie Confer, NP Referring: Brien Few, MD Visit Date: 05/19/2021 Occupation: '@GUAROCC'$ @  Subjective:  Pain in multiple joints.   History of Present Illness: Jennifer Copeland is a 55 y.o. female seen in consultation at the request of Dr. Ronita Hipps.  According the patient in August 2021 she acquired COVID-19 infection.  Soon after that she started experiencing joint pain.  She states she was having severe morning stiffness and joint pain and even nocturnal pain.  The symptoms lingered on and persist.  She continues to have joint discomfort which moves around.  She has had discomfort in her bilateral shoulders, left elbow, bilateral hips which she describes over the trochanteric region occasional discomfort in her knee joints and plantar surface of her feet.  She states she has difficulty walking due to hip pain.  She can manage to do stationary bike.  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  She has not noticed any visible joint swelling.  There is no history of fatigue, oral ulcers, nasal ulcers, malar rash, photosensitivity, Raynaud's phenomenon, lymphadenopathy or inflammatory arthritis.  Her mother has rheumatoid arthritis and osteoarthritis.  Is gravida 5, para 3, miscarriages 2.  He had help syndrome with her second pregnancy and preeclampsia.  No history of DVTs.  Activities of Daily Living:  Patient reports morning stiffness for several hours.   Patient Reports nocturnal pain.  Difficulty dressing/grooming: Denies Difficulty climbing stairs: Denies Difficulty getting out of chair: Denies Difficulty using hands for taps, buttons, cutlery, and/or writing: Denies  Review of Systems  Constitutional:  Negative for fatigue.  HENT:  Negative for mouth sores, mouth dryness and nose  dryness.   Eyes:  Negative for pain, itching and dryness.  Respiratory:  Negative for shortness of breath and difficulty breathing.   Cardiovascular:  Negative for chest pain and palpitations.  Gastrointestinal:  Positive for constipation. Negative for blood in stool and diarrhea.  Endocrine: Negative for increased urination.  Genitourinary:  Negative for difficulty urinating.  Musculoskeletal:  Positive for joint pain, joint pain, myalgias, morning stiffness, muscle tenderness and myalgias. Negative for joint swelling.  Skin:  Negative for color change, rash, redness and sensitivity to sunlight.  Allergic/Immunologic: Negative for susceptible to infections.  Neurological:  Negative for dizziness, numbness, headaches, memory loss and weakness.  Hematological:  Negative for bruising/bleeding tendency and swollen glands.  Psychiatric/Behavioral:  Positive for sleep disturbance. Negative for depressed mood and confusion. The patient is nervous/anxious.    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   Thyroid nodule 12/05/2018    Past Medical History:  Diagnosis Date   Anxiety    Dermatofibroma    Diverticulosis    Hyperthyroidism    Internal hemorrhoids    Migraines    Pneumonia    Thyrotoxicosis     Family History  Problem Relation Age of Onset  Breast cancer Mother    Barrett's esophagus Mother    Macular degeneration Mother    Rheum arthritis Mother    Atrial fibrillation Mother    Anxiety disorder Mother    Depression Mother    Fibromyalgia Mother    COPD Mother    Emphysema Father    High Cholesterol Sister    High Cholesterol Sister    Melanoma Brother     Healthy Son    Healthy Son    Healthy Son    Past Surgical History:  Procedure Laterality Date   ANTERIOR CRUCIATE LIGAMENT REPAIR Right    and PCL   FOOT SURGERY Left    Social History   Social History Narrative   Not on file    There is no immunization history on file for this patient.   Objective: Vital Signs: BP 104/70 (BP Location: Right Arm, Patient Position: Sitting, Cuff Size: Normal)   Pulse 85   Ht '5\' 5"'$  (1.651 m)   Wt 125 lb 9.6 oz (57 kg)   LMP 01/01/2019 (Within Days)   BMI 20.90 kg/m    Physical Exam Vitals and nursing note reviewed.  Constitutional:      Appearance: She is well-developed.  HENT:     Head: Normocephalic and atraumatic.  Eyes:     Conjunctiva/sclera: Conjunctivae normal.  Cardiovascular:     Rate and Rhythm: Normal rate and regular rhythm.     Heart sounds: Normal heart sounds.  Pulmonary:     Effort: Pulmonary effort is normal.     Breath sounds: Normal breath sounds.  Abdominal:     General: Bowel sounds are normal.     Palpations: Abdomen is soft.  Musculoskeletal:     Cervical back: Normal range of motion.  Lymphadenopathy:     Cervical: No cervical adenopathy.  Skin:    General: Skin is warm and dry.     Capillary Refill: Capillary refill takes less than 2 seconds.  Neurological:     Mental Status: She is alert and oriented to person, place, and time.  Psychiatric:        Behavior: Behavior normal.     Musculoskeletal Exam: C-spine, thoracic and lumbar spine were in good range of motion.  Shoulder joints, elbow joints, wrist joints, MCPs PIPs and DIPs with good range of motion with no synovitis.  She has mild tenderness on palpation over left elbow.  Hip joints with good range of motion.  She had tenderness over bilateral anterior superior iliac spine.  There was no tenderness or swelling over knee joints.  There was no tenderness over ankles MTPs or PIPs.  There was no evidence of Achilles tendinitis or plantar  fasciitis.  CDAI Exam: CDAI Score: -- Patient Global: --; Provider Global: -- Swollen: --; Tender: -- Joint Exam 05/19/2021   No joint exam has been documented for this visit   There is currently no information documented on the homunculus. Go to the Rheumatology activity and complete the homunculus joint exam.  Investigation: No additional findings.  Imaging: No results found.  Recent Labs: No results found for: WBC, HGB, PLT, NA, K, CL, CO2, GLUCOSE, BUN, CREATININE, BILITOT, ALKPHOS, AST, ALT, PROT, ALBUMIN, CALCIUM, GFRAA, QFTBGOLD, QFTBGOLDPLUS  Speciality Comments: No specialty comments available.  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  She states the  pain is migratory.  She is experienced pain in her shoulders, left elbow, hips and plantar surface of her feet.  She states the pain has been intermittently severe and also she has nocturnal pain at times.  She has not noticed any joint swelling.  She describes discomfort in her shoulders, left elbow, bilateral anterior she periodically spine and plantar fascial of her feet.  She had no tenderness on my examination today.  No synovitis was noted.  There is no history of joint swelling.  Positive ANA (antinuclear antibody) - 03/15/21: ANA+, dsDNA 13, RNP-, SM-, scl-70-, Ro-, La-, antichromatin ab-, anti-JO1-, anticentromere ab-, RF<10, TSH 0.771 - Plan: Urinalysis, Routine w reflex microscopic, Sedimentation rate she had labs done recently which showed positive ANA.  There is history of fatigue, history of HELLP syndrome with her second pregnancy and preeclampsia.  She denies any history of oral ulcers, nasal ulcers, malar rash, photosensitivity, Raynaud's phenomenon, sicca symptoms or lymphadenopathy.  I will obtain AVISE labs.  Myofascial  pain-she has been experiencing some myofascial pain which could be related to chronic insomnia and stress.  She has been doing some stretching exercises and yoga practice which has been helpful.  I will refer her to integrative therapies.  Primary insomnia-she gives history of chronic insomnia which could be contributing to the fatigue and myofascial pain.  She will be referred to integrative therapies.  She has tried melatonin without much help.  Other fatigue - Plan: CBC with Differential/Platelet, COMPLETE METABOLIC PANEL WITH GFR, CK, Serum protein electrophoresis with reflex  Anxiety-she states she has been experiencing anxiety as well.  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  Vitamin D deficiency -she has history of vitamin D deficiency.  She is been experiencing increased fatigue.  We will check vitamin D level today.  Plan: VITAMIN D 25 Hydroxy (Vit-D Deficiency, Fractures)  History of hyperthyroidism-she is followed by Dr. Cruzita Lederer.  Thyroid nodule - followed by Dr. Cruzita Lederer  Hx of migraines-she has not had migraine headaches recently.  COVID-19 virus infection-August 2021  Family history of rheumatoid arthritis - Mother  Orders: Orders Placed This Encounter  Procedures   CBC with Differential/Platelet   COMPLETE METABOLIC PANEL WITH GFR   Urinalysis, Routine w reflex microscopic   CK   Sedimentation rate   Serum protein electrophoresis with reflex   VITAMIN D 25 Hydroxy (Vit-D Deficiency, Fractures)   Ambulatory referral to Physical Therapy    No orders of the defined types were placed in this encounter.    Follow-Up Instructions: Return for Positive ANA, joint pain.   Bo Merino, MD  Note - This record has been created using Editor, commissioning.  Chart creation errors have been sought, but may not always  have been located. Such creation errors do not reflect on  the standard of medical care.

## 2021-05-19 ENCOUNTER — Encounter: Payer: Self-pay | Admitting: Rheumatology

## 2021-05-19 ENCOUNTER — Other Ambulatory Visit: Payer: Self-pay

## 2021-05-19 ENCOUNTER — Ambulatory Visit: Payer: 59 | Admitting: Rheumatology

## 2021-05-19 VITALS — BP 104/70 | HR 85 | Ht 65.0 in | Wt 125.6 lb

## 2021-05-19 DIAGNOSIS — E041 Nontoxic single thyroid nodule: Secondary | ICD-10-CM

## 2021-05-19 DIAGNOSIS — E559 Vitamin D deficiency, unspecified: Secondary | ICD-10-CM

## 2021-05-19 DIAGNOSIS — M7918 Myalgia, other site: Secondary | ICD-10-CM

## 2021-05-19 DIAGNOSIS — F5101 Primary insomnia: Secondary | ICD-10-CM | POA: Diagnosis not present

## 2021-05-19 DIAGNOSIS — M130 Polyarthritis, unspecified: Secondary | ICD-10-CM

## 2021-05-19 DIAGNOSIS — Z8261 Family history of arthritis: Secondary | ICD-10-CM

## 2021-05-19 DIAGNOSIS — R5383 Other fatigue: Secondary | ICD-10-CM

## 2021-05-19 DIAGNOSIS — Z8669 Personal history of other diseases of the nervous system and sense organs: Secondary | ICD-10-CM

## 2021-05-19 DIAGNOSIS — Z8639 Personal history of other endocrine, nutritional and metabolic disease: Secondary | ICD-10-CM

## 2021-05-19 DIAGNOSIS — F419 Anxiety disorder, unspecified: Secondary | ICD-10-CM

## 2021-05-19 DIAGNOSIS — U071 COVID-19: Secondary | ICD-10-CM

## 2021-05-19 DIAGNOSIS — R768 Other specified abnormal immunological findings in serum: Secondary | ICD-10-CM | POA: Diagnosis not present

## 2021-05-19 NOTE — Patient Instructions (Signed)
Iliotibial Band Syndrome Rehab Ask your health care provider which exercises are safe for you. Do exercises exactly as told by your health care provider and adjust them as directed. It is normal to feel mild stretching, pulling, tightness, or discomfort as you do these exercises. Stop right away if you feel sudden pain or your pain gets significantly worse. Do not begin these exercises until told by your health care provider. Stretching and range-of-motion exercises These exercises warm up your muscles and joints and improve the movement andflexibility of your hip and pelvis. Quadriceps stretch, prone  Lie on your abdomen (prone position) on a firm surface, such as a bed or padded floor. Bend your left / right knee and reach back to hold your ankle or pant leg. If you cannot reach your ankle or pant leg, loop a belt around your foot and grab the belt instead. Gently pull your heel toward your buttocks. Your knee should not slide out to the side. You should feel a stretch in the front of your thigh and knee (quadriceps). Hold this position for __________ seconds. Repeat __________ times. Complete this exercise __________ times a day. Iliotibial band stretch An iliotibial band is a strong band of muscle tissue that runs from the outer side of your hip to the outer side of your thigh and knee. Lie on your side with your left / right leg in the top position. Bend both of your knees and grab your left / right ankle. Stretch out your bottom arm to help you balance. Slowly bring your top knee back so your thigh goes behind your trunk. Slowly lower your top leg toward the floor until you feel a gentle stretch on the outside of your left / right hip and thigh. If you do not feel a stretch and your knee will not fall farther, place the heel of your other foot on top of your knee and pull your knee down toward the floor with your foot. Hold this position for __________ seconds. Repeat __________ times.  Complete this exercise __________ times a day. Strengthening exercises These exercises build strength and endurance in your hip and pelvis. Enduranceis the ability to use your muscles for a long time, even after they get tired. Straight leg raises, side-lying This exercise strengthens the muscles that rotate the leg at the hip and move it away from your body (hip abductors). Lie on your side with your left / right leg in the top position. Lie so your head, shoulder, hip, and knee line up. You may bend your bottom knee to help you balance. Roll your hips slightly forward so your hips are stacked directly over each other and your left / right knee is facing forward. Tense the muscles in your outer thigh and lift your top leg 4-6 inches (10-15 cm). Hold this position for __________ seconds. Slowly lower your leg to return to the starting position. Let your muscles relax completely before doing another repetition. Repeat __________ times. Complete this exercise __________ times a day. Leg raises, prone This exercise strengthens the muscles that move the hips backward (hip extensors). Lie on your abdomen (prone position) on your bed or a firm surface. You can put a pillow under your hips if that is more comfortable for your lower back. Bend your left / right knee so your foot is straight up in the air. Squeeze your buttocks muscles and lift your left / right thigh off the bed. Do not let your back arch. Tense your thigh   muscle as hard as you can without increasing any knee pain. Hold this position for __________ seconds. Slowly lower your leg to return to the starting position and allow it to relax completely. Repeat __________ times. Complete this exercise __________ times a day. Hip hike Stand sideways on a bottom step. Stand on your left / right leg with your other foot unsupported next to the step. You can hold on to a railing or wall for balance if needed. Keep your knees straight and your  torso square. Then lift your left / right hip up toward the ceiling. Slowly let your left / right hip lower toward the floor, past the starting position. Your foot should get closer to the floor. Do not lean or bend your knees. Repeat __________ times. Complete this exercise __________ times a day. This information is not intended to replace advice given to you by your health care provider. Make sure you discuss any questions you have with your healthcare provider. Document Revised: 11/26/2019 Document Reviewed: 11/26/2019 Elsevier Patient Education  2022 [employer]  

## 2021-05-20 NOTE — Progress Notes (Signed)
Vitamin D, CK and sed rate are normal.UA shows few bacteria and 1+ white cells.  If patient has any symptoms of UTI then she should see her PCP.  CBC and CMP are normal.

## 2021-05-24 LAB — MICROSCOPIC MESSAGE

## 2021-05-24 LAB — CK: Total CK: 62 U/L (ref 29–143)

## 2021-05-24 LAB — COMPLETE METABOLIC PANEL WITH GFR
AG Ratio: 1.9 (calc) (ref 1.0–2.5)
ALT: 16 U/L (ref 6–29)
AST: 18 U/L (ref 10–35)
Albumin: 4.4 g/dL (ref 3.6–5.1)
Alkaline phosphatase (APISO): 68 U/L (ref 37–153)
BUN: 20 mg/dL (ref 7–25)
CO2: 29 mmol/L (ref 20–32)
Calcium: 9.7 mg/dL (ref 8.6–10.4)
Chloride: 104 mmol/L (ref 98–110)
Creat: 0.88 mg/dL (ref 0.50–1.03)
Globulin: 2.3 g/dL (calc) (ref 1.9–3.7)
Glucose, Bld: 74 mg/dL (ref 65–99)
Potassium: 4.3 mmol/L (ref 3.5–5.3)
Sodium: 139 mmol/L (ref 135–146)
Total Bilirubin: 0.6 mg/dL (ref 0.2–1.2)
Total Protein: 6.7 g/dL (ref 6.1–8.1)
eGFR: 79 mL/min/{1.73_m2} (ref >=60)

## 2021-05-24 LAB — PROTEIN ELECTROPHORESIS, SERUM, WITH REFLEX
Albumin ELP: 4.4 g/dL (ref 3.8–4.8)
Alpha 1: 0.2 g/dL (ref 0.2–0.3)
Alpha 2: 0.5 g/dL (ref 0.5–0.9)
Beta 2: 0.2 g/dL (ref 0.2–0.5)
Beta Globulin: 0.4 g/dL (ref 0.4–0.6)
Gamma Globulin: 1 g/dL (ref 0.8–1.7)
Total Protein: 6.7 g/dL (ref 6.1–8.1)

## 2021-05-24 LAB — CBC WITH DIFFERENTIAL/PLATELET
Absolute Monocytes: 479 cells/uL (ref 200–950)
Basophils Absolute: 61 cells/uL (ref 0–200)
Basophils Relative: 1.1 %
Eosinophils Absolute: 198 cells/uL (ref 15–500)
Eosinophils Relative: 3.6 %
HCT: 44.2 % (ref 35.0–45.0)
Hemoglobin: 14.7 g/dL (ref 11.7–15.5)
Lymphs Abs: 2107 cells/uL (ref 850–3900)
MCH: 30.1 pg (ref 27.0–33.0)
MCHC: 33.3 g/dL (ref 32.0–36.0)
MCV: 90.6 fL (ref 80.0–100.0)
MPV: 13.3 fL — ABNORMAL HIGH (ref 7.5–12.5)
Monocytes Relative: 8.7 %
Neutro Abs: 2657 cells/uL (ref 1500–7800)
Neutrophils Relative %: 48.3 %
Platelets: 210 10*3/uL (ref 140–400)
RBC: 4.88 10*6/uL (ref 3.80–5.10)
RDW: 12.1 % (ref 11.0–15.0)
Total Lymphocyte: 38.3 %
WBC: 5.5 10*3/uL (ref 3.8–10.8)

## 2021-05-24 LAB — URINALYSIS, ROUTINE W REFLEX MICROSCOPIC
Bilirubin Urine: NEGATIVE
Glucose, UA: NEGATIVE
Hgb urine dipstick: NEGATIVE
Hyaline Cast: NONE SEEN /LPF
Ketones, ur: NEGATIVE
Nitrite: NEGATIVE
Protein, ur: NEGATIVE
RBC / HPF: NONE SEEN /HPF (ref 0–2)
Specific Gravity, Urine: 1.018 (ref 1.001–1.035)
pH: 7 (ref 5.0–8.0)

## 2021-05-24 LAB — VITAMIN D 25 HYDROXY (VIT D DEFICIENCY, FRACTURES): Vit D, 25-Hydroxy: 69 ng/mL (ref 30–100)

## 2021-05-24 LAB — SEDIMENTATION RATE: Sed Rate: 2 mm/h (ref 0–30)

## 2021-05-29 NOTE — Progress Notes (Signed)
Office Visit Note  Patient: Jennifer Copeland             Date of Birth: November 06, 1965           MRN: [id number]             PCP: Elie Confer, NP Referring: Tamala Julian, MD Visit Date: 06/10/2021 Occupation: _0 @  Subjective:  Pain in muscles and joints.   History of Present Illness: Jennifer Copeland is a 55 y.o. female with a history of positive ANA was seen today for follow-up visit.  She denies any history of oral ulcers, nasal ulcers, malar rash, photosensitivity, Raynaud's phenomenon or lymphadenopathy.  She states she continues to have some discomfort in the trapezius region her shoulders and her right hip that she describes over the anterior superior leg spine.  She has some discomfort in her right knee joint.  She had injury in the past.  None of the other joints are painful or swollen.  Activities of Daily Living:  Patient reports morning stiffness for 2-3 hours.   Patient Reports nocturnal pain.  Difficulty dressing/grooming: Denies Difficulty climbing stairs: Denies Difficulty getting out of chair: Denies Difficulty using hands for taps, buttons, cutlery, and/or writing: Denies  Review of Systems  Constitutional:  Negative for fatigue, night sweats, weight gain and weight loss.  HENT:  Negative for mouth sores, trouble swallowing, trouble swallowing, mouth dryness and nose dryness.   Eyes:  Negative for pain, redness, itching, visual disturbance and dryness.  Respiratory:  Negative for cough, shortness of breath and difficulty breathing.   Cardiovascular:  Negative for chest pain, palpitations, hypertension, irregular heartbeat and swelling in legs/feet.  Gastrointestinal:  Positive for constipation. Negative for blood in stool and diarrhea.  Endocrine: Negative for increased urination.  Genitourinary:  Negative for difficulty urinating and vaginal dryness.  Musculoskeletal:  Positive for joint pain, joint pain and morning stiffness. Negative for joint swelling,  myalgias, muscle weakness, muscle tenderness and myalgias.  Skin:  Negative for color change, rash, hair loss, redness, skin tightness, ulcers and sensitivity to sunlight.  Allergic/Immunologic: Positive for susceptible to infections.  Neurological:  Negative for dizziness, numbness, headaches, memory loss, night sweats and weakness.  Hematological:  Positive for bruising/bleeding tendency. Negative for swollen glands.  Psychiatric/Behavioral:  Negative for depressed mood, confusion and sleep disturbance. The patient is not nervous/anxious.    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   Thyroid nodule 12/05/2018    Past Medical History:  Diagnosis Date   Anxiety    Dermatofibroma    Diverticulosis    Hyperthyroidism    Internal hemorrhoids    Migraines    Pneumonia    Thyrotoxicosis     Family History  Problem Relation Age of Onset   Breast cancer Mother    Barrett's esophagus Mother    Macular degeneration Mother    Rheum arthritis Mother    Atrial fibrillation Mother    Anxiety disorder Mother    Depression Mother    Fibromyalgia Mother    COPD Mother    Emphysema Father    High Cholesterol Sister    High Cholesterol Sister    Melanoma Brother  Healthy Son    Healthy Son    Healthy Son    Past Surgical History:  Procedure Laterality Date   ANTERIOR CRUCIATE LIGAMENT REPAIR Right    and PCL   FOOT SURGERY Left    Social History   Social History Narrative   Not on file   Immunization History  Administered Date(s) Administered   Moderna Sars-Covid-2 Vaccination 11/19/2019, 12/17/2019, 09/08/2020, 06/08/2021     Objective: Vital Signs: BP  123/87 (BP Location: Left Wrist, Patient Position: Sitting, Cuff Size: Normal)   Pulse 79   Resp 16   Ht 5' 5" (1.651 m)   Wt 124 lb (56.2 kg)   LMP 01/01/2019 (Within Days)   BMI 20.63 kg/m    Physical Exam Vitals and nursing note reviewed.  Constitutional:      Appearance: She is well-developed.  HENT:     Head: Normocephalic and atraumatic.  Eyes:     Conjunctiva/sclera: Conjunctivae normal.  Cardiovascular:     Rate and Rhythm: Normal rate and regular rhythm.     Heart sounds: Normal heart sounds.  Pulmonary:     Effort: Pulmonary effort is normal.     Breath sounds: Normal breath sounds.  Abdominal:     General: Bowel sounds are normal.     Palpations: Abdomen is soft.  Musculoskeletal:     Cervical back: Normal range of motion.  Lymphadenopathy:     Cervical: No cervical adenopathy.  Skin:    General: Skin is warm and dry.     Capillary Refill: Capillary refill takes less than 2 seconds.  Neurological:     Mental Status: She is alert and oriented to person, place, and time.  Psychiatric:        Behavior: Behavior normal.     Musculoskeletal Exam: C-spine thoracic and lumbar spine with good range of motion.  Shoulder joints, elbow joints, wrist joints, MCPs PIPs and DIPs with good range of motion.  She had bilateral trap trapezius spasm.  She had some discomfort on her right deltoid that she had immunization today.  Elbow joints, wrist joints, MCPs PIPs and DIPs with good range of motion.  She has some tenderness over the right anterior superior iliac spine.  Hip joints, knee joints, ankles, MTPs and PIPs with good range of motion.  She had thickening of bilateral first MTP joints.  CDAI Exam: CDAI Score: -- Patient Global: --; Provider Global: -- Swollen: --; Tender: -- Joint Exam 06/10/2021   No joint exam has been documented for this visit   There is currently no information documented on the homunculus. Go to the Rheumatology activity and complete the  homunculus joint exam.  Investigation: No additional findings.  Imaging: No results found.  Recent Labs: Lab Results  Component Value Date   WBC 5.5 05/19/2021   HGB 14.7 05/19/2021   PLT 210 05/19/2021   NA 139 05/19/2021   K 4.3 05/19/2021   CL 104 05/19/2021   CO2 29 05/19/2021   GLUCOSE 74 05/19/2021   BUN 20 05/19/2021   CREATININE 0.88 05/19/2021   BILITOT 0.6 05/19/2021   AST 18 05/19/2021   ALT 16 05/19/2021   PROT 6.7 05/19/2021   PROT 6.7 05/19/2021   CALCIUM 9.7 05/19/2021   May 19, 2021 UA negative, SPEP normal, CK 62, ESR 2, vitamin D 69  May 19, 2021 AVISE lupus index -1.3, ANA 1: 160NS, dsDNA equivocal, (Smith, RNP, Ro, La, RNA polymerase 3, anticentromere, Jo 1 negative, CB CAP negative, anticardiolipin   negative, beta-2 GP 1 negative, antiphosphatidylserine negative, RF negative, anti-CCP negative, anti-car P negative, antihistone negative, antithyroglobulin negative, anti-TPO positive  03/15/21: ANA+, dsDNA 13, RNP-, SM-, scl-70-, Ro-, La-, antichromatin ab-, anti-JO1-, anticentromere ab-, RF<10, TSH 0.771 -   Speciality Comments: No specialty comments available.  Procedures:  No procedures performed Allergies: Ketorolac and Morphine   Assessment / Plan:     Visit Diagnoses: Positive ANA (antinuclear antibody) - AVISE lupus index -1.3.  ANA was low titer positive.  Double-stranded DNA was equivocal.  None of the other antibodies were positive.  Lab findings were discussed with the patient at length., history of fatigue, HELLP syndrome, preeclampsia.  No history of oral ulcers, malar rash, photosensitivity, Raynaud's, sicca, lymphadenopathy.  At this point I do not believe that she is an underlying autoimmune disease.  We discussed repeating ANA and double-stranded DNA in 1 year.  I also advised her to contact me in case she develops any new symptoms.  Polyarthropathy - Pain in multiple joints since COVID-19 infection August 2020 migratory arthralgias  in the shoulders, elbows, hips and feet.  No synovitis was noted.  Myofascial pain - History of chronic insomnia and stress.  She had bilateral trapezius spasm and some achiness in her tendons and joints.  She was referred to integrative therapies.  She has an appointment with the integrative therapies today.  Primary insomnia - Good sleep hygiene was discussed.  Other fatigue-probably related to chronic insomnia.  Vitamin D deficiency - Vitamin D level is normal.  Other medical problems are listed as follows:  Anxiety  Hx of migraines  History of hyperthyroidism-she has positive anti-TPO antibody.  Thyroid nodule  Family history of rheumatoid arthritis - Mother  COVID-19 virus infection - August 2021  Orders: No orders of the defined types were placed in this encounter.  No orders of the defined types were placed in this encounter.    Follow-Up Instructions: Return in about 1 year (around 06/10/2022) for +ANA.   Bo Merino, MD  Note - This record has been created using Editor, commissioning.  Chart creation errors have been sought, but may not always  have been located. Such creation errors do not reflect on  the standard of medical care.

## 2021-06-05 ENCOUNTER — Encounter: Payer: Self-pay | Admitting: Rheumatology

## 2021-06-09 NOTE — Telephone Encounter (Signed)
I called patient, patient will keep 06/10/2021 appt.

## 2021-06-10 ENCOUNTER — Other Ambulatory Visit: Payer: Self-pay

## 2021-06-10 ENCOUNTER — Ambulatory Visit: Payer: 59 | Admitting: Rheumatology

## 2021-06-10 ENCOUNTER — Encounter: Payer: Self-pay | Admitting: Rheumatology

## 2021-06-10 VITALS — BP 123/87 | HR 79 | Resp 16 | Ht 65.0 in | Wt 124.0 lb

## 2021-06-10 DIAGNOSIS — R768 Other specified abnormal immunological findings in serum: Secondary | ICD-10-CM

## 2021-06-10 DIAGNOSIS — Z8639 Personal history of other endocrine, nutritional and metabolic disease: Secondary | ICD-10-CM

## 2021-06-10 DIAGNOSIS — E559 Vitamin D deficiency, unspecified: Secondary | ICD-10-CM

## 2021-06-10 DIAGNOSIS — Z8261 Family history of arthritis: Secondary | ICD-10-CM

## 2021-06-10 DIAGNOSIS — U071 COVID-19: Secondary | ICD-10-CM

## 2021-06-10 DIAGNOSIS — R5383 Other fatigue: Secondary | ICD-10-CM

## 2021-06-10 DIAGNOSIS — M7918 Myalgia, other site: Secondary | ICD-10-CM | POA: Diagnosis not present

## 2021-06-10 DIAGNOSIS — Z8669 Personal history of other diseases of the nervous system and sense organs: Secondary | ICD-10-CM

## 2021-06-10 DIAGNOSIS — E041 Nontoxic single thyroid nodule: Secondary | ICD-10-CM

## 2021-06-10 DIAGNOSIS — M130 Polyarthritis, unspecified: Secondary | ICD-10-CM | POA: Diagnosis not present

## 2021-06-10 DIAGNOSIS — F5101 Primary insomnia: Secondary | ICD-10-CM | POA: Diagnosis not present

## 2021-06-10 DIAGNOSIS — F419 Anxiety disorder, unspecified: Secondary | ICD-10-CM

## 2021-06-20 ENCOUNTER — Encounter: Payer: Self-pay | Admitting: Internal Medicine

## 2021-11-20 ENCOUNTER — Other Ambulatory Visit: Payer: Self-pay | Admitting: Internal Medicine

## 2021-12-30 ENCOUNTER — Telehealth: Payer: Self-pay

## 2021-12-30 NOTE — Telephone Encounter (Signed)
Patient is asking for a call from provider or nurse or a mychart message . Patient had labs done at PCP and they were faxed over to [place] today 12/30/2021. Patient is wanting provider to tell her what she should do for her levels in the mean time or her advise. ?Patient is scheduled with Dr. Cruzita Lederer on 01/11/2022 ?   ? ? ?Please call and advise  ?

## 2022-01-02 ENCOUNTER — Encounter: Payer: Self-pay | Admitting: Internal Medicine

## 2022-01-11 ENCOUNTER — Ambulatory Visit: Payer: 59 | Admitting: Internal Medicine

## 2022-01-16 ENCOUNTER — Ambulatory Visit: Payer: 59 | Admitting: Internal Medicine

## 2022-01-17 ENCOUNTER — Ambulatory Visit: Payer: 59 | Admitting: Internal Medicine
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
## 2022-01-27 ENCOUNTER — Encounter: Payer: Self-pay | Admitting: Internal Medicine

## 2022-01-27 ENCOUNTER — Ambulatory Visit (INDEPENDENT_AMBULATORY_CARE_PROVIDER_SITE_OTHER): Payer: 59 | Admitting: Internal Medicine

## 2022-01-27 VITALS — BP 120/74 | HR 73 | Ht 65.0 in | Wt 129.8 lb

## 2022-01-27 DIAGNOSIS — E0590 Thyrotoxicosis, unspecified without thyrotoxic crisis or storm: Secondary | ICD-10-CM

## 2022-01-27 DIAGNOSIS — E042 Nontoxic multinodular goiter: Secondary | ICD-10-CM
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
## 2022-01-27 NOTE — Patient Instructions (Signed)
Please stop at the lab. ? ?Please continue Methimazole 5 mg in the a and 2.5 in the pm. ? ?Please come back for a follow-up appointment in 6 months. ? ? ?

## 2022-01-28 LAB — T4, FREE: Free T4: 0.9 ng/dL (ref 0.8–1.8)

## 2022-01-28 LAB — T3, FREE: T3, Free: 3.2 pg/mL (ref 2.3–4.2)

## 2022-01-28 LAB — TSH: TSH: 2.71 mIU/L

## 2022-01-31 MED ORDER — METHIMAZOLE 5 MG PO TABS: ORAL_TABLET | ORAL | 3 refills | Status: DC

## 2022-02-10 ENCOUNTER — Telehealth: Payer: Self-pay

## 2022-02-10 NOTE — Telephone Encounter (Signed)
Yes, let's get the U/S of the thyroid, as planned. ?

## 2022-02-10 NOTE — Telephone Encounter (Signed)
Called to advise they contacted the pt to schedule her thyroid ultrasound and pt wanted confirmation to schedule. Pt was advised of recent lab results and was not sure if the ultrasound was still needed. ?

## 2022-02-14 NOTE — Telephone Encounter (Signed)
Lvm for pt advising U/S is still recommended and to follow up with scheduling. ?

## 2022-02-17 ENCOUNTER — Encounter: Payer: Self-pay | Admitting: Internal Medicine

## 2022-02-17 ENCOUNTER — Ambulatory Visit
Admission: RE | Admit: 2022-02-17 | Discharge: 2022-02-17 | Disposition: A | Discharge status: Home / Self Care | Payer: 59 | Source: Ambulatory Visit | Attending: Internal Medicine | Admitting: Internal Medicine

## 2022-02-17 DIAGNOSIS — E042 Nontoxic multinodular goiter: Secondary | ICD-10-CM

## 2022-07-27 ENCOUNTER — Encounter: Payer: Self-pay | Admitting: Emergency Medicine

## 2022-07-27 ENCOUNTER — Ambulatory Visit: Payer: 59 | Admitting: Emergency Medicine

## 2022-07-27 ENCOUNTER — Telehealth: Payer: Self-pay | Admitting: Emergency Medicine

## 2022-07-27 VITALS — BP 118/82 | HR 77 | Temp 98.2°F | Ht 65.0 in | Wt 131.4 lb

## 2022-07-27 DIAGNOSIS — R051 Acute cough: Secondary | ICD-10-CM

## 2022-07-27 DIAGNOSIS — R059 Cough, unspecified: Secondary | ICD-10-CM | POA: Insufficient documentation

## 2022-07-27 DIAGNOSIS — R052 Subacute cough: Secondary | ICD-10-CM

## 2022-07-27 MED ORDER — AZITHROMYCIN 1 G PO PACK: 1.0000 g | PACK | Freq: Once | ORAL | 0 refills | Status: DC

## 2022-07-27 MED ORDER — AZITHROMYCIN 250 MG PO TABS: ORAL_TABLET | ORAL | 0 refills | Status: AC

## 2022-07-27 MED ORDER — HYDROCODONE BIT-HOMATROP MBR 5-1.5 MG/5ML PO SOLN: 5.0000 mL | Freq: Four times a day (QID) | ORAL | 0 refills | Status: AC | PRN

## 2022-07-27 NOTE — Progress Notes (Signed)
Subjective:    Patient ID: Jennifer Copeland, female    DOB: Jul 27, 1966, 56 y.o.   MRN: [id number]  HPI Pleasant 56 year old woman with a history of hyperthyroidism with thyroid nodules (under evaluation), hyperlipidemia, never smoker and otherwise healthy. She may have had some asthma type symptoms with prior URI's, never officially diagnosed. She was diagnosed with COVID 19 on 05/19/20.   With COVID she initially dealt with URI sx, congestion, HA, fever. A lot of the sx improved but she has significant fatigue. She feels a "brain fog". Some HA but less than initially. She is now experiencing chest tightness, some mid to L substernal pain, seems to happen at times of more activity. For the last 2 evenings she has had some chest pain that woke her from sleep. Lasted about an hour. No sour taste associated with this. Overall her functional capacity is improving but she is not near her usual active baseline.   ROV 07/14/2020 --this follow-up visit for 56 year old never smoker with hypothyroidism/thyroid nodules, hyperlipidemia, question asthma type syndrome in the past with URI.  She had COVID-19 pneumonia diagnosed 05/19/2020.  In the aftermath of that she had persistent chest tightness, some substernal discomfort that seem to correlate with activity.  Had not returned to her prior baseline with regard to exertional tolerance, seemed to keep a "brain fog".  She did not appear trial of omeprazole since last visit, reports.  Overall she is a bit better, but still feels some exertional dyspnea, chest tightness. She has discovered that there may be a mold exposure at her office - it is being cleaned up.  Chest x-ray did not show any evidence for interstitial disease or pulmonary infiltrates.  There was question of a right upper lobe 1.3 cm rounded density of unclear etiology.  We have not yet performed a CT chest.  Acute OV 07/27/22 --56 year old woman, never smoker with hypothyroidism, hyperlipidemia, asthma  symptoms that have been associated with URI in the past.  I have seen her following a significant COVID-pneumonia in 05/2020.  She has pulmonary function testing 07/2020 that are consistent with mild obstruction. She is here today reporting that she began to have UA sx, lost her voice. Has had head congestion, some yellow/ clear. Lots of cough, sometimes productive. She tried pred - didn't seem to help. She has tried albuterol, may have helped some. Has used plain mucinex, Nyquill.  Flu and COVID-19 tested and both negative   Review of Systems As per HPI      Objective:   Physical Exam  Vitals:   07/27/22 0925  BP: 118/82  Pulse: 77  Temp: 98.2 F (36.8 C)  TempSrc: Oral  SpO2: 98%  Weight: 131 lb 6.4 oz (59.6 kg)  Height: '5\' 5"'$  (1.651 m)   Gen: Pleasant, well-nourished, in no distress,  normal affect  ENT: No lesions,  mouth clear,  oropharynx clear, no postnasal drip  Neck: No JVD, no stridor  Lungs: No use of accessory muscles, no crackles or wheezing on normal respiration, no wheeze on forced expiration  Cardiovascular: RRR, heart sounds normal, no murmur or gallops, no peripheral edema  Musculoskeletal: No deformities, no cyanosis or clubbing  Neuro: alert, awake, non focal  Skin: Warm, no lesions or rash     Assessment & Plan:  Cough In the setting of an apparent URI.  Her flu and COVID were both negative.  She has a preasthma phenotype, borderline obstruction on pulmonary function testing but reassuring exam today without wheezing.  She has upper airway irritation, cough.  She did not respond to prednisone (early in the course).  I think is reasonable to give her azithromycin in case there is an evolving bacterial component here.  She will use over-the-counter symptom relief.  I will give her prescription for Hycodan for cough suppression.   Agree with using your over-the-counter medications: Mucinex, DayQuil, NyQuil as needed Take azithromycin until completely  gone Keep your albuterol available to use 2 puffs if needed for shortness of breath, chest tightness, wheezing. Try using Hycodan cough syrup, 5 cc up to every 6 hours if needed for cough suppression.  Please note that this can cause drowsiness and use carefully. Follow Dr. Lamonte Sakai in about 2 months to discuss possible repeat pulmonary function testing Please call if you are not improving so we can see you sooner.  Baltazar Apo, MD, PhD 07/27/2022, 4:49 PM [facility] [phone] or if no answer [phone]

## 2022-07-27 NOTE — Patient Instructions (Signed)
Agree with using your over-the-counter medications: Mucinex, DayQuil, NyQuil as needed Take azithromycin until completely gone Keep your albuterol available to use 2 puffs if needed for shortness of breath, chest tightness, wheezing. Try using Hycodan cough syrup, 5 cc up to every 6 hours if needed for cough suppression.  Please note that this can cause drowsiness and use carefully. Follow Dr. Lamonte Sakai in about 2 months to discuss possible repeat pulmonary function testing Please call if you are not improving so we can see you sooner.

## 2022-07-27 NOTE — Telephone Encounter (Signed)
Called and spoke with pt letting her know that we would need to see her for an appt due to last seen in 2021. Pt verbalized understanding. Acute visit scheduled for pt today at 9:15 with RB. Nothing further needed.

## 2022-07-27 NOTE — Telephone Encounter (Signed)
Checked with RB about the azithromycin Rx and per RB, pt was to have zpak sent to pharmacy. I have sent Rx to pharmacy for pt. Called and spoke with pt letting her know this had been done and she verbalized understanding. Nothing further needed.

## 2022-07-27 NOTE — Assessment & Plan Note (Signed)
In the setting of an apparent URI.  Her flu and COVID were both negative.  She has a preasthma phenotype, borderline obstruction on pulmonary function testing but reassuring exam today without wheezing.  She has upper airway irritation, cough.  She did not respond to prednisone (early in the course).  I think is reasonable to give her azithromycin in case there is an evolving bacterial component here.  She will use over-the-counter symptom relief.  I will give her prescription for Hycodan for cough suppression.   Agree with using your over-the-counter medications: Mucinex, DayQuil, NyQuil as needed Take azithromycin until completely gone Keep your albuterol available to use 2 puffs if needed for shortness of breath, chest tightness, wheezing. Try using Hycodan cough syrup, 5 cc up to every 6 hours if needed for cough suppression.  Please note that this can cause drowsiness and use carefully. Follow Dr. Lamonte Sakai in about 2 months to discuss possible repeat pulmonary function testing Please call if you are not improving so we can see you sooner.

## 2022-08-04 ENCOUNTER — Encounter: Payer: Self-pay | Admitting: Internal Medicine

## 2022-08-04 ENCOUNTER — Ambulatory Visit: Payer: 59 | Admitting: Internal Medicine

## 2022-08-04 VITALS — BP 120/82 | HR 63 | Ht 65.0 in | Wt 131.0 lb

## 2022-08-04 DIAGNOSIS — E042 Nontoxic multinodular goiter: Secondary | ICD-10-CM | POA: Diagnosis not present

## 2022-08-04 DIAGNOSIS — E0590 Thyrotoxicosis, unspecified without thyrotoxic crisis or storm: Secondary | ICD-10-CM | POA: Diagnosis not present

## 2022-08-04 LAB — T4, FREE: Free T4: 0.6 ng/dL (ref 0.60–1.60)

## 2022-08-04 LAB — T3, FREE: T3, Free: 4.4 pg/mL — ABNORMAL HIGH (ref 2.3–4.2)

## 2022-08-04 LAB — TSH: TSH: 3.15 u[IU]/mL (ref 0.35–5.50)

## 2022-08-04 MED ORDER — METHIMAZOLE 5 MG PO TABS: ORAL_TABLET | ORAL | 3 refills | Status: DC
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
## 2022-08-04 NOTE — Patient Instructions (Addendum)
Please stop at the lab.  Please continue Methimazole 5 mg in the a and 2.5 in the pm.  Please try to send me the CD with the latest U/S of the thyroid.  Please come back for a follow-up appointment in 6 months.

## 2022-09-15 ENCOUNTER — Ambulatory Visit: Payer: 59 | Admitting: Emergency Medicine

## 2023-02-02 ENCOUNTER — Ambulatory Visit: Payer: 59 | Admitting: Internal Medicine

## 2023-03-13 ENCOUNTER — Other Ambulatory Visit: Payer: Self-pay

## 2023-03-13 MED ORDER — METHIMAZOLE 5 MG PO TABS: ORAL_TABLET | ORAL | 3 refills | Status: DC

## 2023-04-13 ENCOUNTER — Other Ambulatory Visit: Payer: Self-pay

## 2023-04-13 DIAGNOSIS — E0590 Thyrotoxicosis, unspecified without thyrotoxic crisis or storm: Secondary | ICD-10-CM

## 2023-04-13 MED ORDER — METHIMAZOLE 5 MG PO TABS: ORAL_TABLET | ORAL | Status: DC

## 2023-04-25 DIAGNOSIS — L821 Other seborrheic keratosis: Secondary | ICD-10-CM | POA: Diagnosis not present

## 2023-04-25 DIAGNOSIS — D2262 Melanocytic nevi of left upper limb, including shoulder: Secondary | ICD-10-CM | POA: Diagnosis not present

## 2023-04-25 DIAGNOSIS — D2261 Melanocytic nevi of right upper limb, including shoulder: Secondary | ICD-10-CM | POA: Diagnosis not present

## 2023-04-25 DIAGNOSIS — D225 Melanocytic nevi of trunk: Secondary | ICD-10-CM | POA: Diagnosis not present

## 2023-04-25 DIAGNOSIS — L570 Actinic keratosis: Secondary | ICD-10-CM | POA: Diagnosis not present

## 2023-04-25 DIAGNOSIS — L820 Inflamed seborrheic keratosis: Secondary | ICD-10-CM | POA: Diagnosis not present

## 2023-05-03 DIAGNOSIS — U071 COVID-19: Secondary | ICD-10-CM | POA: Diagnosis not present

## 2023-05-16 DIAGNOSIS — Z01419 Encounter for gynecological examination (general) (routine) without abnormal findings: Secondary | ICD-10-CM | POA: Diagnosis not present

## 2023-05-16 DIAGNOSIS — Z1231 Encounter for screening mammogram for malignant neoplasm of breast: Secondary | ICD-10-CM | POA: Diagnosis not present

## 2023-05-16 DIAGNOSIS — Z124 Encounter for screening for malignant neoplasm of cervix: Secondary | ICD-10-CM | POA: Diagnosis not present

## 2023-06-12 ENCOUNTER — Ambulatory Visit: Payer: BC Managed Care – PPO | Admitting: Internal Medicine

## 2023-06-12 ENCOUNTER — Encounter: Payer: Self-pay | Admitting: Internal Medicine

## 2023-06-12 VITALS — BP 120/60 | HR 60 | Ht 65.0 in | Wt 129.2 lb

## 2023-06-12 DIAGNOSIS — E0590 Thyrotoxicosis, unspecified without thyrotoxic crisis or storm: Secondary | ICD-10-CM

## 2023-06-12 DIAGNOSIS — E042 Nontoxic multinodular goiter: Secondary | ICD-10-CM | POA: Diagnosis not present

## 2023-06-12 NOTE — Patient Instructions (Addendum)
Please stop at the lab.  Please continue Methimazole ~5 mg daily.  Schedule another thyroid ultrasound.  Please come back for a follow-up appointment in 1 year with labs at 6 months.
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
## 2023-06-13 LAB — TSH: TSH: 0.761 u[IU]/mL (ref 0.450–4.500)

## 2023-06-13 LAB — T4, FREE: Free T4: 0.99 ng/dL (ref 0.82–1.77)

## 2023-06-13 LAB — T3, FREE: T3, Free: 2.5 pg/mL (ref 2.0–4.4)

## 2023-06-13 MED ORDER — METHIMAZOLE 5 MG PO TABS
ORAL_TABLET | ORAL | 3 refills | Status: DC
Start: 2023-06-13 — End: 2024-06-19

## 2023-08-22 ENCOUNTER — Ambulatory Visit
Admission: RE | Admit: 2023-08-22 | Discharge: 2023-08-22 | Disposition: A | Discharge status: Home / Self Care | Payer: BC Managed Care – PPO | Source: Ambulatory Visit | Attending: Internal Medicine | Admitting: Internal Medicine

## 2023-08-22 ENCOUNTER — Encounter: Payer: Self-pay | Admitting: Internal Medicine

## 2023-08-22 DIAGNOSIS — E042 Nontoxic multinodular goiter: Secondary | ICD-10-CM | POA: Diagnosis not present

## 2023-08-23 ENCOUNTER — Encounter: Payer: Self-pay | Admitting: Internal Medicine

## 2023-09-24 DIAGNOSIS — U071 COVID-19: Secondary | ICD-10-CM | POA: Diagnosis not present

## 2023-11-06 DIAGNOSIS — B349 Viral infection, unspecified: Secondary | ICD-10-CM | POA: Diagnosis not present

## 2023-11-13 DIAGNOSIS — J018 Other acute sinusitis: Secondary | ICD-10-CM | POA: Diagnosis not present

## 2023-12-18 ENCOUNTER — Encounter: Payer: Self-pay | Admitting: Internal Medicine

## 2023-12-18 ENCOUNTER — Ambulatory Visit: Payer: BC Managed Care – PPO | Admitting: Internal Medicine

## 2023-12-18 VITALS — BP 112/60 | HR 58 | Ht 65.0 in | Wt 129.6 lb

## 2023-12-18 DIAGNOSIS — E042 Nontoxic multinodular goiter: Secondary | ICD-10-CM | POA: Diagnosis not present

## 2023-12-18 DIAGNOSIS — E0590 Thyrotoxicosis, unspecified without thyrotoxic crisis or storm: Secondary | ICD-10-CM | POA: Diagnosis not present

## 2023-12-18 NOTE — Patient Instructions (Signed)
 Please stop at the lab.  Please continue Methimazole 5 mg daily.  Please come back for a follow-up appointment in 1 year with labs at 6 months.
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
## 2023-12-28 DIAGNOSIS — E0590 Thyrotoxicosis, unspecified without thyrotoxic crisis or storm: Secondary | ICD-10-CM | POA: Diagnosis not present

## 2023-12-28 DIAGNOSIS — E782 Mixed hyperlipidemia: Secondary | ICD-10-CM | POA: Diagnosis not present

## 2023-12-28 DIAGNOSIS — E042 Nontoxic multinodular goiter: Secondary | ICD-10-CM | POA: Diagnosis not present

## 2023-12-29 LAB — T4, FREE: Free T4: 1.05 ng/dL (ref 0.82–1.77)

## 2023-12-29 LAB — TSH: TSH: 1.55 u[IU]/mL (ref 0.450–4.500)

## 2023-12-31 ENCOUNTER — Other Ambulatory Visit: Payer: Self-pay

## 2023-12-31 ENCOUNTER — Encounter (HOSPITAL_BASED_OUTPATIENT_CLINIC_OR_DEPARTMENT_OTHER): Payer: Self-pay | Admitting: Urology

## 2023-12-31 ENCOUNTER — Emergency Department (HOSPITAL_BASED_OUTPATIENT_CLINIC_OR_DEPARTMENT_OTHER)
Admission: EM | Admit: 2023-12-31 | Discharge: 2024-01-01 | Disposition: A | Discharge status: Home / Self Care | Attending: Emergency Medicine | Admitting: Emergency Medicine

## 2023-12-31 ENCOUNTER — Emergency Department (HOSPITAL_BASED_OUTPATIENT_CLINIC_OR_DEPARTMENT_OTHER)

## 2023-12-31 DIAGNOSIS — R072 Precordial pain: Secondary | ICD-10-CM | POA: Insufficient documentation

## 2023-12-31 DIAGNOSIS — E876 Hypokalemia: Secondary | ICD-10-CM | POA: Insufficient documentation

## 2023-12-31 DIAGNOSIS — R079 Chest pain, unspecified: Secondary | ICD-10-CM | POA: Diagnosis not present

## 2023-12-31 LAB — BASIC METABOLIC PANEL WITH GFR
Anion gap: 8 (ref 5–15)
BUN: 19 mg/dL (ref 6–20)
CO2: 25 mmol/L (ref 22–32)
Calcium: 9.3 mg/dL (ref 8.9–10.3)
Chloride: 105 mmol/L (ref 98–111)
Creatinine, Ser: 0.93 mg/dL (ref 0.44–1.00)
GFR, Estimated: >60 mL/min (ref >60)
Glucose, Bld: 107 mg/dL — ABNORMAL HIGH (ref 70–99)
Potassium: 3.4 mmol/L — ABNORMAL LOW (ref 3.5–5.1)
Sodium: 138 mmol/L (ref 135–145)

## 2023-12-31 LAB — CBC WITH DIFFERENTIAL/PLATELET
Abs Immature Granulocytes: 0.01 10*3/uL (ref 0.00–0.07)
Basophils Absolute: 0.1 10*3/uL (ref 0.0–0.1)
Basophils Relative: 1 %
Eosinophils Absolute: 0.2 10*3/uL (ref 0.0–0.5)
Eosinophils Relative: 4 %
HCT: 42.4 % (ref 36.0–46.0)
Hemoglobin: 15 g/dL (ref 12.0–15.0)
Immature Granulocytes: 0 %
Lymphocytes Relative: 48 %
Lymphs Abs: 3.1 10*3/uL (ref 0.7–4.0)
MCH: 31.4 pg (ref 26.0–34.0)
MCHC: 35.4 g/dL (ref 30.0–36.0)
MCV: 88.7 fL (ref 80.0–100.0)
Monocytes Absolute: 0.4 10*3/uL (ref 0.1–1.0)
Monocytes Relative: 7 %
Neutro Abs: 2.5 10*3/uL (ref 1.7–7.7)
Neutrophils Relative %: 40 %
Platelets: 216 10*3/uL (ref 150–400)
RBC: 4.78 MIL/uL (ref 3.87–5.11)
RDW: 11.6 % (ref 11.5–15.5)
WBC: 6.3 10*3/uL (ref 4.0–10.5)
nRBC: 0 % (ref 0.0–0.2)

## 2023-12-31 LAB — TROPONIN I (HIGH SENSITIVITY): Troponin I (High Sensitivity): 2 ng/L (ref <18)

## 2023-12-31 MED ORDER — DICYCLOMINE HCL 10 MG PO CAPS
10.0000 mg | ORAL_CAPSULE | Freq: Once | ORAL | Status: AC
  Administered 2024-01-01: 10 mg via ORAL
  Filled 2023-12-31: qty 1

## 2023-12-31 MED ORDER — ALUM & MAG HYDROXIDE-SIMETH 200-200-20 MG/5ML PO SUSP
30.0000 mL | Freq: Once | ORAL | Status: AC
  Administered 2024-01-01: 30 mL via ORAL
  Filled 2023-12-31: qty 30

## 2023-12-31 NOTE — ED Triage Notes (Signed)
 Pt states chest paint that started 45 min pta  Denies SOB or nausea  States some lightheadedness   2 asa 81 mg given pta

## 2024-01-01 ENCOUNTER — Encounter: Payer: Self-pay | Admitting: Internal Medicine

## 2024-01-01 LAB — TROPONIN I (HIGH SENSITIVITY): Troponin I (High Sensitivity): 2 ng/L (ref <18)

## 2024-01-01 MED ORDER — FAMOTIDINE 20 MG PO TABS: 20.0000 mg | ORAL_TABLET | Freq: Two times a day (BID) | ORAL | 0 refills | Status: AC

## 2024-01-01 NOTE — ED Notes (Signed)
 Pt hooked to the monitor with a 5 lead, BP cuff and pulse ox

## 2024-01-01 NOTE — ED Provider Notes (Signed)
 [facility] Provider Note   CSN: [id number] Arrival date & time: 12/31/23  2307     History  Chief Complaint  Patient presents with   Chest Pain    Jennifer Copeland is a 58 y.o. female.  The history is provided by the patient.  Chest Pain Pain location:  Substernal area Pain quality: pressure   Pain radiates to:  Does not radiate Pain severity:  Moderate Onset quality:  Sudden Duration:  1 hour Timing:  Constant Progression:  Unchanged Chronicity:  New Context comment:  Ate a steak with olive oil and brussel sprouts with olive oil then did yoga and then haad pain.  No SOB,  No DOE.  No n/v/d. Relieved by:  Nothing Worsened by:  Nothing Ineffective treatments:  None tried Associated symptoms: no back pain, no cough, no fever, no lower extremity edema, no nausea, no palpitations, no shortness of breath, no syncope and no vomiting   Risk factors: no birth control, no coronary artery disease, no diabetes mellitus, no high cholesterol, no hypertension, not female, no smoking and no surgery   Patient with hyperthyroidism has substernal CP following a meal and yoga.  No exertional symptoms.    Past Medical History:  Diagnosis Date   Anxiety    Dermatofibroma    Diverticulosis    Hyperthyroidism    Internal hemorrhoids    Migraines    Pneumonia    Thyrotoxicosis        Home Medications Prior to Admission medications   Medication Sig Start Date End Date Taking? Authorizing Provider  famotidine (PEPCID) 20 MG tablet Take 1 tablet (20 mg total) by mouth 2 (two) times daily. 01/01/24  Yes Luiza Carranco, MD  azithromycin (ZITHROMAX) 250 MG tablet Take two today and then one daily until finished. Patient not taking: Reported on 12/18/2023 07/27/22   Leslye Peer, MD  HYDROcodone bit-homatropine (HYCODAN) 5-1.5 MG/5ML syrup Take 5 mLs by mouth every 6 (six) hours as needed for cough. Patient not taking: Reported on 12/18/2023  07/27/22   Leslye Peer, MD  hydrocortisone (ANUSOL-HC) 2.5 % rectal cream Place 1 application rectally 2 (two) times daily. Patient not taking: Reported on 12/18/2023 11/07/19   Zehr, Princella Pellegrini, PA-C  linaclotide [facility]) 145 MCG CAPS capsule Take 1 capsule (145 mcg total) by mouth daily before breakfast. Patient not taking: Reported on 12/18/2023 09/16/20   Beverley Fiedler, MD  methimazole (TAPAZOLE) 5 MG tablet Take by mouth 1 tablet in a.m. 06/13/23   Carlus Pavlov, MD  Omega-3 Fatty Acids (FISH OIL PO) Take by mouth.    [provider]  progesterone (PROMETRIUM) 200 MG capsule Take 200 mg by mouth at bedtime.    [provider]  VITAMIN D PO Take by mouth.    [provider]      Allergies    Ketorolac and Morphine    Review of Systems   Review of Systems  Constitutional:  Negative for fever.  Respiratory:  Negative for cough and shortness of breath.   Cardiovascular:  Positive for chest pain. Negative for palpitations, leg swelling and syncope.  Gastrointestinal:  Negative for nausea and vomiting.  Musculoskeletal:  Negative for back pain.  All other systems reviewed and are negative.   Physical Exam Updated Vital Signs BP (!) 156/79 (BP Location: Left Arm)   Pulse 77   Temp 97.7 F (36.5 C)   Resp 18   Ht 5\' 5"  (1.651 m)  Wt 58.7 kg   LMP 01/01/2019 (Within Days)   SpO2 100%   BMI 21.53 kg/m  Physical Exam Vitals and nursing note reviewed.  Constitutional:      General: She is not in acute distress.    Appearance: She is well-developed.  HENT:     Head: Normocephalic and atraumatic.     Nose: Nose normal.  Eyes:     Pupils: Pupils are equal, round, and reactive to light.  Cardiovascular:     Rate and Rhythm: Normal rate and regular rhythm.     Pulses: Normal pulses.     Heart sounds: Normal heart sounds.  Pulmonary:     Effort: Pulmonary effort is normal. No respiratory distress.     Breath sounds: Normal breath sounds.   Abdominal:     General: There is no distension.     Palpations: Abdomen is soft.     Tenderness: There is no abdominal tenderness. There is no guarding or rebound.     Comments: Gassy throughout   Musculoskeletal:        General: No tenderness. Normal range of motion.     Cervical back: Neck supple.     Right lower leg: No edema.     Left lower leg: No edema.     Comments: Negative Homan's signs  Skin:    General: Skin is warm and dry.     Capillary Refill: Capillary refill takes less than 2 seconds.     Findings: No erythema or rash.  Neurological:     General: No focal deficit present.     Mental Status: She is oriented to person, place, and time.     Deep Tendon Reflexes: Reflexes normal.  Psychiatric:        Mood and Affect: Mood normal.     ED Results / Procedures / Treatments   Labs (all labs ordered are listed, but only abnormal results are displayed) Results for orders placed or performed during the hospital encounter of 12/31/23  CBC with Differential   Collection Time: 12/31/23 11:14 PM  Result Value Ref Range   WBC 6.3 4.0 - 10.5 K/uL   RBC 4.78 3.87 - 5.11 MIL/uL   Hemoglobin 15.0 12.0 - 15.0 g/dL   HCT 40.9 81.1 - 91.4 %   MCV 88.7 80.0 - 100.0 fL   MCH 31.4 26.0 - 34.0 pg   MCHC 35.4 30.0 - 36.0 g/dL   RDW 78.2 95.6 - 21.3 %   Platelets 216 150 - 400 K/uL   nRBC 0.0 0.0 - 0.2 %   Neutrophils Relative % 40 %   Neutro Abs 2.5 1.7 - 7.7 K/uL   Lymphocytes Relative 48 %   Lymphs Abs 3.1 0.7 - 4.0 K/uL   Monocytes Relative 7 %   Monocytes Absolute 0.4 0.1 - 1.0 K/uL   Eosinophils Relative 4 %   Eosinophils Absolute 0.2 0.0 - 0.5 K/uL   Basophils Relative 1 %   Basophils Absolute 0.1 0.0 - 0.1 K/uL   Immature Granulocytes 0 %   Abs Immature Granulocytes 0.01 0.00 - 0.07 K/uL  Basic metabolic panel   Collection Time: 12/31/23 11:14 PM  Result Value Ref Range   Sodium 138 135 - 145 mmol/L   Potassium 3.4 (L) 3.5 - 5.1 mmol/L   Chloride 105 98 - 111  mmol/L   CO2 25 22 - 32 mmol/L   Glucose, Bld 107 (H) 70 - 99 mg/dL   BUN 19 6 - 20 mg/dL  Creatinine, Ser 0.93 0.44 - 1.00 mg/dL   Calcium 9.3 8.9 - 16.1 mg/dL   GFR, Estimated >09 >60 mL/min   Anion gap 8 5 - 15  Troponin I (High Sensitivity)   Collection Time: 12/31/23 11:14 PM  Result Value Ref Range   Troponin I (High Sensitivity) 2 <18 ng/L  Troponin I (High Sensitivity)   Collection Time: 01/01/24  1:29 AM  Result Value Ref Range   Troponin I (High Sensitivity) 2 <18 ng/L   DG Chest Portable 1 View Result Date: 12/31/2023 CLINICAL DATA:  Pain EXAM: PORTABLE CHEST 1 VIEW COMPARISON:  07/14/2020 FINDINGS: The heart size and mediastinal contours are within normal limits. Both lungs are clear. The visualized skeletal structures are unremarkable. IMPRESSION: No active disease. Electronically Signed   By: Charlett Nose M.D.   On: 12/31/2023 23:35     EKG  EKG Interpretation Date/Time:  Monday December 31 2023 23:19:08 EDT Ventricular Rate:  72 PR Interval:  136 QRS Duration:  80 QT Interval:  408 QTC Calculation: 446 R Axis:   39  Text Interpretation: Normal sinus rhythm Confirmed by Nicanor Alcon, Chandelle Harkey ([id number]) on 01/01/2024 2:54:51 AM         Radiology DG Chest Portable 1 View Result Date: 12/31/2023 CLINICAL DATA:  Pain EXAM: PORTABLE CHEST 1 VIEW COMPARISON:  07/14/2020 FINDINGS: The heart size and mediastinal contours are within normal limits. Both lungs are clear. The visualized skeletal structures are unremarkable. IMPRESSION: No active disease. Electronically Signed   By: Charlett Nose M.D.   On: 12/31/2023 23:35    Procedures Procedures    Medications Ordered in ED Medications  alum & mag hydroxide-simeth (MAALOX/MYLANTA) 200-200-20 MG/5ML suspension 30 mL (30 mLs Oral Given 01/01/24 0009)  dicyclomine (BENTYL) capsule 10 mg (10 mg Oral Given 01/01/24 0009)    ED Course/ Medical Decision Making/ A&P                                 Medical Decision Making Patient  with SSCP post eating and yoga   Amount and/or Complexity of Data Reviewed Independent Historian: spouse    Details: See above  External Data Reviewed: notes.    Details: Previous notes reviewed  Labs: ordered.    Details: Normal white count 6.3, normal hemoglobin 15, normal platelets.  Normal sodium 138, potassium slight low 3.4, normal creatinine 0.93.  2 negative troponins 2/2.  Radiology: ordered and independent interpretation performed.    Details: Normal CXR ECG/medicine tests: ordered and independent interpretation performed. Decision-making details documented in ED Course.  Risk OTC drugs. Prescription drug management. Risk Details: Ruled out for MI based on EKG, and Troponins  HEART score is 1 very low risk for MACE.  Given time course and symptoms this is very likely GERD related to meal prior to yoga. I will start 2 weeks of pepcid and have instructed gerd friendly diet.  I do not believe this is a PE based on symptoms and history and exam.  Stable for discharge.  Strict returns.      Final Clinical Impression(s) / ED Diagnoses Final diagnoses:  Precordial pain   No signs of systemic illness or infection. The patient is nontoxic-appearing on exam and vital signs are within normal limits.  I have reviewed the triage vital signs and the nursing notes. Pertinent labs & imaging results that were available during my care of the patient were reviewed by me and considered in  my medical decision making (see chart for details). After history, exam, and medical workup I feel the patient has been appropriately medically screened and is safe for discharge home. Pertinent diagnoses were discussed with the patient. Patient was given return precautions.  Rx / DC Orders ED Discharge Orders          Ordered    famotidine (PEPCID) 20 MG tablet  2 times daily        01/01/24 0231              Wael Maestas, MD 01/01/24 4098

## 2024-01-03 DIAGNOSIS — R079 Chest pain, unspecified: Secondary | ICD-10-CM | POA: Diagnosis not present

## 2024-03-26 DIAGNOSIS — L820 Inflamed seborrheic keratosis: Secondary | ICD-10-CM | POA: Diagnosis not present

## 2024-04-30 DIAGNOSIS — D225 Melanocytic nevi of trunk: Secondary | ICD-10-CM | POA: Diagnosis not present

## 2024-04-30 DIAGNOSIS — D2262 Melanocytic nevi of left upper limb, including shoulder: Secondary | ICD-10-CM | POA: Diagnosis not present

## 2024-04-30 DIAGNOSIS — L738 Other specified follicular disorders: Secondary | ICD-10-CM | POA: Diagnosis not present

## 2024-04-30 DIAGNOSIS — L570 Actinic keratosis: Secondary | ICD-10-CM | POA: Diagnosis not present

## 2024-04-30 DIAGNOSIS — D1724 Benign lipomatous neoplasm of skin and subcutaneous tissue of left leg: Secondary | ICD-10-CM | POA: Diagnosis not present

## 2024-06-19 ENCOUNTER — Other Ambulatory Visit: Payer: Self-pay | Admitting: Internal Medicine

## 2024-06-19 DIAGNOSIS — E0590 Thyrotoxicosis, unspecified without thyrotoxic crisis or storm: Secondary | ICD-10-CM

## 2024-06-19 NOTE — Telephone Encounter (Signed)
 Refill request complete

## 2024-07-02 DIAGNOSIS — F4320 Adjustment disorder, unspecified: Secondary | ICD-10-CM | POA: Diagnosis not present

## 2024-07-11 DIAGNOSIS — Z1231 Encounter for screening mammogram for malignant neoplasm of breast: Secondary | ICD-10-CM | POA: Diagnosis not present

## 2024-07-11 DIAGNOSIS — Z01419 Encounter for gynecological examination (general) (routine) without abnormal findings: Secondary | ICD-10-CM | POA: Diagnosis not present

## 2024-07-11 DIAGNOSIS — Z1331 Encounter for screening for depression: Secondary | ICD-10-CM | POA: Diagnosis not present

## 2024-07-14 ENCOUNTER — Other Ambulatory Visit: Payer: Self-pay | Admitting: Obstetrics

## 2024-07-14 DIAGNOSIS — Z8342 Family history of familial hypercholesterolemia: Secondary | ICD-10-CM

## 2024-07-22 DIAGNOSIS — F4320 Adjustment disorder, unspecified: Secondary | ICD-10-CM | POA: Diagnosis not present

## 2024-07-25 ENCOUNTER — Ambulatory Visit
Admission: RE | Admit: 2024-07-25 | Discharge: 2024-07-25 | Disposition: A | Discharge status: Home / Self Care | Source: Ambulatory Visit | Attending: Obstetrics | Admitting: Obstetrics

## 2024-07-25 DIAGNOSIS — Z8342 Family history of familial hypercholesterolemia: Secondary | ICD-10-CM

## 2024-08-07 DIAGNOSIS — F4320 Adjustment disorder, unspecified: Secondary | ICD-10-CM | POA: Diagnosis not present

## 2024-08-20 DIAGNOSIS — A6000 Herpesviral infection of urogenital system, unspecified: Secondary | ICD-10-CM | POA: Diagnosis not present

## 2024-09-06 DIAGNOSIS — J209 Acute bronchitis, unspecified: Secondary | ICD-10-CM | POA: Diagnosis not present

## 2024-09-17 DIAGNOSIS — F4320 Adjustment disorder, unspecified: Secondary | ICD-10-CM | POA: Diagnosis not present

## 2024-12-19 ENCOUNTER — Ambulatory Visit: Admitting: Internal Medicine
